# Patient Record
Sex: Female | Born: 2000 | Race: Black or African American | Hispanic: No | Marital: Married | Smoking: Never smoker
Health system: Southern US, Community
[De-identification: ages and names within clinical notes are randomized; demographics above are authoritative.]

## PROBLEM LIST (undated history)

## (undated) DIAGNOSIS — J302 Other seasonal allergic rhinitis: Secondary | ICD-10-CM

## (undated) DIAGNOSIS — O139 Gestational [pregnancy-induced] hypertension without significant proteinuria, unspecified trimester: Secondary | ICD-10-CM

## (undated) DIAGNOSIS — J45909 Unspecified asthma, uncomplicated: Secondary | ICD-10-CM

## (undated) DIAGNOSIS — I10 Essential (primary) hypertension: Secondary | ICD-10-CM

## (undated) DIAGNOSIS — L309 Dermatitis, unspecified: Secondary | ICD-10-CM

## (undated) DIAGNOSIS — Z803 Family history of malignant neoplasm of breast: Secondary | ICD-10-CM

## (undated) DIAGNOSIS — E7800 Pure hypercholesterolemia, unspecified: Secondary | ICD-10-CM

## (undated) DIAGNOSIS — L509 Urticaria, unspecified: Secondary | ICD-10-CM

## (undated) HISTORY — DX: Unspecified asthma, uncomplicated: J45.909

## (undated) HISTORY — PX: NO PAST SURGERIES: SHX2092

## (undated) HISTORY — DX: Family history of malignant neoplasm of breast: Z80.3

## (undated) HISTORY — DX: Urticaria, unspecified: L50.9

## (undated) HISTORY — DX: Gestational (pregnancy-induced) hypertension without significant proteinuria, unspecified trimester: O13.9

## (undated) HISTORY — DX: Pure hypercholesterolemia, unspecified: E78.00

---
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
## 2018-01-26 ENCOUNTER — Encounter: Payer: Self-pay | Admitting: Allergy & Immunology

## 2018-01-26 ENCOUNTER — Ambulatory Visit (INDEPENDENT_AMBULATORY_CARE_PROVIDER_SITE_OTHER): Payer: Managed Care, Other (non HMO) | Admitting: Allergy & Immunology

## 2018-01-26 VITALS — BP 140/96 | HR 80 | Temp 98.6°F | Resp 18 | Ht 63.0 in | Wt 173.0 lb

## 2018-01-26 DIAGNOSIS — J4530 Mild persistent asthma, uncomplicated: Secondary | ICD-10-CM | POA: Diagnosis not present

## 2018-01-26 DIAGNOSIS — T781XXD Other adverse food reactions, not elsewhere classified, subsequent encounter: Secondary | ICD-10-CM | POA: Diagnosis not present

## 2018-01-26 DIAGNOSIS — L2084 Intrinsic (allergic) eczema: Secondary | ICD-10-CM

## 2018-01-26 DIAGNOSIS — J302 Other seasonal allergic rhinitis: Secondary | ICD-10-CM

## 2018-01-26 DIAGNOSIS — J3089 Other allergic rhinitis: Secondary | ICD-10-CM

## 2018-01-26 MED ORDER — AUVI-Q 0.3 MG/0.3ML IJ SOAJ: INTRAMUSCULAR | 3 refills | Status: DC

## 2018-01-26 MED ORDER — ALBUTEROL SULFATE HFA 108 (90 BASE) MCG/ACT IN AERS: 2.0000 | INHALATION_SPRAY | Freq: Four times a day (QID) | RESPIRATORY_TRACT | 2 refills | Status: DC | PRN

## 2018-01-26 MED ORDER — CETIRIZINE HCL 10 MG PO CHEW: 10.0000 mg | CHEWABLE_TABLET | Freq: Every day | ORAL | 5 refills | Status: DC

## 2018-01-26 MED ORDER — MOMETASONE FUROATE 0.1 % EX OINT: TOPICAL_OINTMENT | CUTANEOUS | 5 refills | Status: DC

## 2018-01-26 MED ORDER — TRIAMCINOLONE ACETONIDE 0.5 % EX OINT: 1.0000 "application " | TOPICAL_OINTMENT | Freq: Two times a day (BID) | CUTANEOUS | 0 refills | Status: DC

## 2018-01-26 MED ORDER — MONTELUKAST SODIUM 10 MG PO TABS: 10.0000 mg | ORAL_TABLET | Freq: Every day | ORAL | 5 refills | Status: DC

## 2018-01-26 NOTE — Progress Notes (Signed)
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  She has developed eczematous flares on her bilateral upper extremities.  This seems to be due to running out of her mometasone.  Given the severity, we will treat her with a slightly stronger steroid for 1 to 2 weeks before stepping down therapy to the mometasone.  I do not think she needs systemic prednisone at this point, but we will leave this option open if there is no improvement.  Her lung testing looks great and her asthma seems well controlled with Singulair monotherapy.  Allergic rhinitis is controlled with nasal saline rinses and antihistamine.  She does not tolerate nasal steroids.  Her food reactions are a little more difficult to ascertain.  She reports that she has been positive to tree nuts in the past, but Dr. Kathyrn Lass note mentions that her labs are all been negative.  In any case, she is not interested in reintroducing any foods and since she has been stable on her current diet she will not make any changes at this time.  Plan/Recommendations:   1. Mild persistent asthma, uncomplicated - Lung testing looked great today. - We will not make any changes today at all. - Daily controller medication(s): Singulair 10mg  daily - Prior to physical activity: ProAir 2 puffs 10-15 minutes before physical activity. - Rescue medications: ProAir 4 puffs every 4-6 hours as needed - Asthma control goals:  * Full participation in all desired activities (may need albuterol before activity) * Albuterol use two time or less a week on average (not counting use with activity) * Cough interfering with sleep two time or less a month * Oral steroids no more than once a year * No hospitalizations  2. Intrinsic atopic  dermatitis  - Add on triamcinolone 0.5% ointment twice daily to the affected areas for 1-2 weeks until it resolves. - Hopefully this will avoid the need for systemic steroids (prednisone). - We will refill the mometasone for you as well.   3. Seasonal and perennial allergic rhinitis - Continue with nasal saline rinses as needed. - Continue with Zyrtec (cetirizine) 10mg  daily.  4. Food allergies (tree nuts, ? chicken) - Continue to avoid all of your triggering foods. - EpiPen and Audry Riles are up to date.  5. Return in about 6 months (around 07/28/2018).  Subjective:   Nhyla Nappi is a 17 y.o. female presenting today for follow up of  Chief Complaint  Patient presents with  . Asthma    doing very well. rare inhaler use.   . Eczema    currently having some issues. nothing seems to be helping. changed soaps/detergents. no relief.   . Allergic Reaction    [name] has a history of the following: Patient Active Problem List   Diagnosis Date Noted  . Family history of breast cancer in mother 04/15/2017  . Constipation 04/15/2017  . Sensation of feeling cold 04/15/2017  . Tired 04/15/2017  . Allergic reaction 02/20/2015  . Allergic rhinoconjunctivitis 02/20/2015  . Mild persistent asthma 02/20/2015  . 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 Acne vulgaris 02/20/2015    History obtained from: chart review and patient.  [facility]'s Primary Care Provider is Vella Kohler, MD.     Chellie is a 17 y.o.  female presenting for a follow up visit.  She is followed by Dr. Lucie Leather.  She was last seen in May 2018 for her history of asthma, allergic rhinoconjunctivitis, and atopic dermatitis.  At that time, her asthma was under good control.  However, she was endorsing nasal congestion, sneezing, and itchy watery eyes.  Her eczema was controlled with topical mometasone.  Her EpiPen was refilled.  She was continued on nasal steroids 1 spray per nostril daily as well as  Singulair 10 mg daily.  Since the last visit, she has mostly done well. She is having an eczema flare right now. She has tried Lubriderm, [place], and [facility]. She is using Vaseline Extreme Heal. She has mometasone which she has been out of for a while. Typically it is just in the creases of her arms, but it is on her wrists today. She has not needed prednisone for her skin. It has not been oozy at all. She has needed antibiotics for a Staphylococcal skin infection. She did have impetigo when she was much younger. She no longer sees a Armed forces operational officer. This I the worst time of the year for her eczema.   Asthma/Respiratory Symptom History: She is currently on albuterol to use as needed. She is on Singulair daily as needed. She has not needed prednisone in two years for her breathing when she was diagnosed with bronchitis. Her albuterol inhalers typically expire. She denies night time coughing and wheezing.   Allergic Rhinitis Symptom History: She does have a nasal steroid to use as needed. She reports burning with the nose spray. She is spraying it correctly but it continues to burn. She does not take a daily antihistamine at all.    Food Allergy Symptom History: She had blood testing performed in November 2016 that was negative to the tree nut and peanut panel as well as the alpha gal panel. She continues to avoid this due to fear of reactions.   Otherwise, there have been no changes to her past medical history, surgical history, family history, or social history.    Review of Systems: a 14-point review of systems is pertinent for what is mentioned in HPI.  Otherwise, all other systems were negative.  Constitutional: negative other than that listed in the HPI Eyes: negative other than that listed in the HPI Ears, nose, mouth, throat, and face: negative other than that listed in the HPI Respiratory: negative other than that listed in the HPI Cardiovascular: negative other than that listed in the  HPI Gastrointestinal: negative other than that listed in the HPI Genitourinary: negative other than that listed in the HPI Integument: negative other than that listed in the HPI Hematologic: negative other than that listed in the HPI Musculoskeletal: negative other than that listed in the HPI Neurological: negative other than that listed in the HPI Allergy/Immunologic: negative other than that listed in the HPI    Objective:   Blood pressure (!) 140/96, pulse 80, temperature 98.6 F (37 C), temperature source Oral, resp. rate 18, height 5\' 3"  (1.6 m), weight 173 lb (78.5 kg), SpO2 98 %. Body mass index is 30.65 kg/m.   Physical Exam:  General: Alert, interactive, in no acute distress. Smiling and pleasant female.  Eyes: No conjunctival injection bilaterally, no discharge on the right, no discharge on the left and no Horner-Trantas dots present. PERRL bilaterally. EOMI without pain. No photophobia.  Ears: Right TM pearly gray with normal light reflex, Left TM pearly gray with normal light reflex, Right TM intact without  perforation and Left TM intact without perforation.  Nose/Throat: External nose within normal limits and septum midline. Turbinates edematous and pale with thick discharge. Posterior oropharynx mildly erythematous without cobblestoning in the posterior oropharynx. Tonsils 2+ without exudates.  Tongue without thrush. Lungs: Clear to auscultation without wheezing, rhonchi or rales. No increased work of breathing. CV: Normal S1/S2. No murmurs. Capillary refill <2 seconds.  Skin: Dry, mildly hyperpigmented, mildly thickened patches on the Bilateral wrist as well as bilateral antecubital fossa. Neuro:   Grossly intact. No focal deficits appreciated. Responsive to questions.  Diagnostic studies:   Spirometry: results normal (FEV1: 2.63/97%, FVC: 3.16/94%, FEV1/FVC: 83%).    Spirometry consistent with normal pattern.   Allergy Studies: none        Malachi Bonds, MD   [facility]

## 2018-01-26 NOTE — Patient Instructions (Addendum)
1. Mild persistent asthma, uncomplicated - Lung testing looked great today. - We will not make any changes today at all. - Daily controller medication(s): Singulair 10mg  daily - Prior to physical activity: ProAir 2 puffs 10-15 minutes before physical activity. - Rescue medications: ProAir 4 puffs every 4-6 hours as needed - Asthma control goals:  * Full participation in all desired activities (may need albuterol before activity) * Albuterol use two time or less a week on average (not counting use with activity) * Cough interfering with sleep two time or less a month * Oral steroids no more than once a year * No hospitalizations  2. Intrinsic atopic dermatitis  - Add on triamcinolone 0.5% ointment twice daily to the affected areas for 1-2 weeks until it resolves. - Hopefully this will avoid the need for systemic steroids (prednisone). - We will refill the mometasone for you as well.   3. Seasonal and perennial allergic rhinitis - Continue with nasal saline rinses as needed. - Continue with Zyrtec (cetirizine) 10mg  daily.  4. Food allergies (tree nuts, ? chicken) - Continue to avoid all of your triggering foods. - EpiPen and Audry Riles are up to date.  5. Return in about 6 months (around 07/28/2018).    Please inform us of any Emergency Department visits, hospitalizations, or changes in symptoms. Call us before going to the ED for breathing or allergy symptoms since we might be able to fit you in for a sick visit. Feel free to contact us anytime with any questions, problems, or concerns.  It was a pleasure to meet you today!  Websites that have reliable patient information: 1. American Academy of Asthma, Allergy, and Immunology: www.aaaai.org 2. Food Allergy Research and Education (FARE): foodallergy.org 3. Mothers of Asthmatics: http://www.asthmacommunitynetwork.org 4. American College of Allergy, Asthma, and Immunology: MissingWeapons.ca   Make sure you are registered to vote! If you  have moved or changed any of your contact information, you will need to get this updated before voting!

## 2018-01-27 ENCOUNTER — Telehealth: Payer: Self-pay

## 2018-01-27 NOTE — Telephone Encounter (Signed)
Pharmacy called and stated patient insurance will not cover Proventil and was wondering if they could switch it to Ventolin. Informed pharmacy that would be fine.

## 2018-01-28 ENCOUNTER — Other Ambulatory Visit: Payer: Self-pay

## 2018-01-28 MED ORDER — AUVI-Q 0.3 MG/0.3ML IJ SOAJ: INTRAMUSCULAR | 3 refills | Status: DC

## 2018-01-28 NOTE — Progress Notes (Signed)
Auvi-Q went to wrong pharmacy. New Rx sent to [facility].

## 2019-02-03 ENCOUNTER — Other Ambulatory Visit: Payer: Self-pay | Admitting: Allergy & Immunology

## 2019-02-21 ENCOUNTER — Other Ambulatory Visit: Payer: Self-pay

## 2019-02-21 DIAGNOSIS — Z20822 Contact with and (suspected) exposure to covid-19: Secondary | ICD-10-CM

## 2019-02-22 LAB — NOVEL CORONAVIRUS, NAA: SARS-CoV-2, NAA: NOT DETECTED

## 2019-02-28 ENCOUNTER — Other Ambulatory Visit: Payer: Self-pay

## 2019-02-28 ENCOUNTER — Telehealth: Payer: Managed Care, Other (non HMO) | Admitting: Physician Assistant

## 2019-02-28 DIAGNOSIS — Z20822 Contact with and (suspected) exposure to covid-19: Secondary | ICD-10-CM

## 2019-02-28 NOTE — Progress Notes (Signed)
Erroneous encounter-disregard

## 2019-03-02 LAB — NOVEL CORONAVIRUS, NAA: SARS-CoV-2, NAA: NOT DETECTED

## 2019-11-28 ENCOUNTER — Other Ambulatory Visit: Payer: Managed Care, Other (non HMO)

## 2020-02-05 ENCOUNTER — Other Ambulatory Visit: Payer: Self-pay

## 2020-02-05 ENCOUNTER — Ambulatory Visit
Admission: RE | Admit: 2020-02-05 | Discharge: 2020-02-05 | Disposition: A | Discharge status: Home / Self Care | Payer: Managed Care, Other (non HMO) | Source: Ambulatory Visit | Attending: Emergency Medicine | Admitting: Emergency Medicine

## 2020-02-05 VITALS — BP 141/83 | HR 134 | Temp 102.6°F | Resp 19 | Ht 64.0 in | Wt 170.0 lb

## 2020-02-05 DIAGNOSIS — R509 Fever, unspecified: Secondary | ICD-10-CM | POA: Diagnosis not present

## 2020-02-05 DIAGNOSIS — R059 Cough, unspecified: Secondary | ICD-10-CM

## 2020-02-05 DIAGNOSIS — R062 Wheezing: Secondary | ICD-10-CM

## 2020-02-05 DIAGNOSIS — J4521 Mild intermittent asthma with (acute) exacerbation: Secondary | ICD-10-CM

## 2020-02-05 MED ORDER — PREDNISONE 10 MG (21) PO TBPK: ORAL_TABLET | Freq: Every day | ORAL | 0 refills | Status: DC

## 2020-02-05 MED ORDER — BENZONATATE 100 MG PO CAPS: 100.0000 mg | ORAL_CAPSULE | Freq: Three times a day (TID) | ORAL | 0 refills | Status: DC

## 2020-02-05 MED ORDER — IBUPROFEN 800 MG PO TABS
800.0000 mg | ORAL_TABLET | Freq: Once | ORAL | Status: AC
  Administered 2020-02-05: 800 mg via ORAL

## 2020-02-05 NOTE — ED Provider Notes (Signed)
[facility]   [id number] 02/05/20 Arrival Time: 1733   CC: COVID symptoms  SUBJECTIVE: History from: patient.  Bianca Mclaughlin is a 19 y.o. female who presents with runny nose, congestion, wheezing, cough and fever x 1 day.  BF with similar symptoms, unsure of covid exposure.  Denies alleviating or aggravating factors.  Denies previous symptoms in the past.   Denies SOB, wheezing, chest pain, nausea, changes in bowel or bladder habits.    ROS: As per HPI.  All other pertinent ROS negative.     Past Medical History:  Diagnosis Date  . Asthma   . Family history of breast cancer in mother 04/15/2017   Start mammogram at 28.Marland KitchenShe is taking various herbal and vitamin products. Mom was 40  . Seasonal allergies   . Urticaria    History reviewed. No pertinent surgical history. Allergies  Allergen Reactions  . Macadamia Nut Oil Hives  . Tree Extract Hives   No current facility-administered medications on file prior to encounter.   Current Outpatient Medications on File Prior to Encounter  Medication Sig Dispense Refill  . adapalene (DIFFERIN) 0.1 % cream Apply to acne at bedtime as directed. 45 g 5  . albuterol (PROVENTIL HFA) 108 (90 Base) MCG/ACT inhaler Inhale 2 puffs into the lungs every 6 (six) hours as needed for wheezing or shortness of breath (cough). 1 Inhaler 2  . AUVI-Q 0.3 MG/0.3ML SOAJ injection Use as directed for life-threatening allergic reaction. 4 Device 3  . cetirizine (ZYRTEC) 10 MG chewable tablet Chew 1 tablet (10 mg total) by mouth daily. 30 tablet 5  . hydrocortisone valerate ointment (WEST-CORT) 0.2 % Apply 1 application topically 2 (two) times daily.    Marland Kitchen levonorgestrel-ethinyl estradiol (AVIANE,ALESSE,LESSINA) 0.1-20 MG-MCG tablet Take 1 tablet by mouth daily. Take .1 mg tablet each morning.    . mometasone (ELOCON) 0.1 % ointment APPLY  OINTMENT TOPICALLY TO ECZEMA ONCE DAILY AFTER  SHOWER 45 g 0  . montelukast (SINGULAIR) 10 MG tablet TAKE 1  TABLET BY MOUTH AT BEDTIME 30 tablet 0  . triamcinolone ointment (KENALOG) 0.5 % APPLY  OINTMENT TOPICALLY TWICE DAILY 30 g 0   Social History   Socioeconomic History  . Marital status: Single    Spouse name: Not on file  . Number of children: Not on file  . Years of education: Not on file  . Highest education level: Not on file  Occupational History  . Not on file  Tobacco Use  . Smoking status: Never Smoker  . Smokeless tobacco: Never Used  Vaping Use  . Vaping Use: Some days  . Substances: Nicotine, Flavoring  Substance and Sexual Activity  . Alcohol use: No    Alcohol/week: 0.0 standard drinks  . Drug use: No  . Sexual activity: Yes    Birth control/protection: Pill, Condom  Other Topics Concern  . Not on file  Social History Narrative  . Not on file   Social Determinants of Health   Financial Resource Strain:   . Difficulty of Paying Living Expenses: Not on file  Food Insecurity:   . Worried About Programme researcher, broadcasting/film/video in the Last Year: Not on file  . Ran Out of Food in the Last Year: Not on file  Transportation Needs:   . Lack of Transportation (Medical): Not on file  . Lack of Transportation (Non-Medical): Not on file  Physical Activity:   . Days of Exercise per Week: Not on file  . Minutes of Exercise per Session:  Not on file  Stress:   . Feeling of Stress : Not on file  Social Connections:   . Frequency of Communication with Friends and Family: Not on file  . Frequency of Social Gatherings with Friends and Family: Not on file  . Attends Religious Services: Not on file  . Active Member of Clubs or Organizations: Not on file  . Attends Banker Meetings: Not on file  . Marital Status: Not on file  Intimate Partner Violence:   . Fear of Current or Ex-Partner: Not on file  . Emotionally Abused: Not on file  . Physically Abused: Not on file  . Sexually Abused: Not on file   Family History  Problem Relation Age of Onset  . Hypertension Mother    . Breast cancer Mother 26  . Allergic rhinitis Father   . Eczema Father   . Urticaria Father   . Hypertension Father   . Eczema Sister   . Breast cancer Maternal Aunt 50  . Hypertension Maternal Aunt   . Breast cancer Paternal Aunt 39  . Asthma Neg Hx   . Immunodeficiency Neg Hx     OBJECTIVE:  Vitals:   02/05/20 1746 02/05/20 1747  BP: (!) 141/83   Pulse: (!) 134   Resp: 19   Temp: (!) 102.6 F (39.2 C)   TempSrc: Oral   SpO2: 96%   Weight:  170 lb (77.1 kg)  Height:  5\' 4"  (1.626 m)    General appearance: alert; appears fatigued, but nontoxic; speaking in full sentences and tolerating own secretions HEENT: NCAT; Ears: EACs clear, TMs pearly gray; Eyes: PERRL.  EOM grossly intact. Nose: nares patent without rhinorrhea, Throat: oropharynx clear, tonsils non erythematous or enlarged, uvula midline  Neck: supple without LAD Lungs: unlabored respirations, symmetrical air entry; cough: absent; no respiratory distress; diffuse wheezing heard throughout Heart: regular rate and rhythm.   Skin: warm and dry Psychological: alert and cooperative; normal mood and affect  ASSESSMENT & PLAN:  1. Fever, unspecified   2. Wheezing   3. Cough   4. Mild intermittent asthma with exacerbation     Meds ordered this encounter  Medications  . ibuprofen (ADVIL) tablet 800 mg  . benzonatate (TESSALON) 100 MG capsule    Sig: Take 1 capsule (100 mg total) by mouth every 8 (eight) hours.    Dispense:  21 capsule    Refill:  0    Order Specific Question:   Supervising Provider    Answer:   Eustace Moore  . predniSONE (STERAPRED UNI-PAK 21 TAB) 10 MG (21) TBPK tablet    Sig: Take by mouth daily. Take 6 tabs by mouth daily  for 2 days, then 5 tabs for 2 days, then 4 tabs for 2 days, then 3 tabs for 2 days, 2 tabs for 2 days, then 1 tab by mouth daily for 2 days    Dispense:  42 tablet    Refill:  0    Order Specific Question:   Supervising Provider    Answer:   [[id number]] Eustace Moore   Declines steroid shot COVID testing ordered.  It will take between 5-7 days for test results.  Someone will contact you regarding abnormal results.    In the meantime: You should remain isolated in your home for 10 days from symptom onset AND greater than 72 hours after symptoms resolution (absence of fever without the use of fever-reducing medication and improvement in respiratory symptoms), whichever is  longer Get plenty of rest and push fluids Tessalon Perles prescribed for cough Prednisone prescribed.  Take as directed Use OTC zyrtec for nasal congestion, runny nose, and/or sore throat Use OTC flonase for nasal congestion and runny nose Use medications daily for symptom relief Use OTC medications like ibuprofen or tylenol as needed fever or pain Call or go to the ED if you have any new or worsening symptoms such as fever, worsening cough, shortness of breath, chest tightness, chest pain, turning blue, changes in mental status, etc...   Reviewed expectations re: course of current medical issues. Questions answered. Outlined signs and symptoms indicating need for more acute intervention. Patient verbalized understanding. After Visit Summary given.         Rennis Harding, PA-C 02/05/20 1822

## 2020-02-05 NOTE — ED Triage Notes (Signed)
Wheezing and coughing since yesterday

## 2020-02-05 NOTE — Discharge Instructions (Signed)
COVID testing ordered.  It will take between 5-7 days for test results.  Someone will contact you regarding abnormal results.    In the meantime: You should remain isolated in your home for 10 days from symptom onset AND greater than 72 hours after symptoms resolution (absence of fever without the use of fever-reducing medication and improvement in respiratory symptoms), whichever is longer Get plenty of rest and push fluids Tessalon Perles prescribed for cough Prednisone prescribed.  Take as directed Use OTC zyrtec for nasal congestion, runny nose, and/or sore throat Use OTC flonase for nasal congestion and runny nose Use medications daily for symptom relief Use OTC medications like ibuprofen or tylenol as needed fever or pain Call or go to the ED if you have any new or worsening symptoms such as fever, worsening cough, shortness of breath, chest tightness, chest pain, turning blue, changes in mental status, etc...  

## 2020-02-06 LAB — COVID-19, FLU A+B AND RSV
Influenza A, NAA: NOT DETECTED
Influenza B, NAA: NOT DETECTED
RSV, NAA: NOT DETECTED
SARS-CoV-2, NAA: NOT DETECTED

## 2020-02-07 ENCOUNTER — Other Ambulatory Visit: Payer: Self-pay

## 2020-02-07 ENCOUNTER — Ambulatory Visit: Payer: Managed Care, Other (non HMO) | Admitting: Internal Medicine

## 2020-02-07 ENCOUNTER — Encounter: Payer: Self-pay | Admitting: Internal Medicine

## 2020-02-07 VITALS — BP 134/86 | HR 92 | Temp 98.9°F | Resp 18 | Ht 64.0 in | Wt 169.0 lb

## 2020-02-07 DIAGNOSIS — J069 Acute upper respiratory infection, unspecified: Secondary | ICD-10-CM | POA: Diagnosis not present

## 2020-02-07 DIAGNOSIS — Z9109 Other allergy status, other than to drugs and biological substances: Secondary | ICD-10-CM | POA: Insufficient documentation

## 2020-02-07 DIAGNOSIS — R030 Elevated blood-pressure reading, without diagnosis of hypertension: Secondary | ICD-10-CM | POA: Diagnosis not present

## 2020-02-07 DIAGNOSIS — J4530 Mild persistent asthma, uncomplicated: Secondary | ICD-10-CM

## 2020-02-07 DIAGNOSIS — Z7689 Persons encountering health services in other specified circumstances: Secondary | ICD-10-CM | POA: Insufficient documentation

## 2020-02-07 DIAGNOSIS — Z124 Encounter for screening for malignant neoplasm of cervix: Secondary | ICD-10-CM

## 2020-02-07 DIAGNOSIS — L309 Dermatitis, unspecified: Secondary | ICD-10-CM | POA: Insufficient documentation

## 2020-02-07 MED ORDER — AZITHROMYCIN 250 MG PO TABS: ORAL_TABLET | ORAL | 0 refills | Status: DC

## 2020-02-07 MED ORDER — ALBUTEROL SULFATE HFA 108 (90 BASE) MCG/ACT IN AERS: 2.0000 | INHALATION_SPRAY | Freq: Four times a day (QID) | RESPIRATORY_TRACT | 3 refills | Status: DC | PRN

## 2020-02-07 NOTE — Assessment & Plan Note (Signed)
Uses triamcinolone cream, well controlled

## 2020-02-07 NOTE — Assessment & Plan Note (Signed)
Care established Previous chart reviewed History and medications reviewed with the patient 

## 2020-02-07 NOTE — Assessment & Plan Note (Signed)
Nasal congestion, sore throat and cough with intermittent fever Likely acute bronchitis with URTI Prescribed azithromycin Tylenol as needed for fever/myalgias Albuterol as needed for dyspnea/wheezing

## 2020-02-07 NOTE — Assessment & Plan Note (Signed)
Takes Singulair, well controlled

## 2020-02-07 NOTE — Assessment & Plan Note (Signed)
Recent episode of acute bronchitis On Prednisone taper Albuterol PRN Benzonatate PRN for cough

## 2020-02-07 NOTE — Patient Instructions (Signed)
Please start taking Azithromycin as prescribed.  Please continue to take steroid as prescribed.  Please use Albuterol inhaler as needed for shortness of breath or wheezing. Tylenol for fever/muscle aches.  Please check BP at home/pharmacy and bring the log in the next visit.  Please follow DASH diet and continue to perform moderate exercise.  DASH stands for Dietary Approaches to Stop Hypertension. The DASH diet is a healthy-eating plan designed to help treat or prevent high blood pressure (hypertension).  The DASH diet includes foods that are rich in potassium, calcium and magnesium. These nutrients help control blood pressure. The diet limits foods that are high in sodium, saturated fat and added sugars.  Studies have shown that the DASH diet can lower blood pressure in as little as two weeks. The diet can also lower low-density lipoprotein (LDL or "bad") cholesterol levels in the blood. High blood pressure and high LDL cholesterol levels are two major risk factors for heart disease and stroke.    DASH diet: Recommended servings The DASH diet provides daily and weekly nutritional goals. The number of servings you should have depends on your daily calorie needs.  Here's a look at the recommended servings from each food group for a 2,000-calorie-a-day DASH diet:  Grains: 6 to 8 servings a day. One serving is one slice bread, 1 ounce dry cereal, or 1/2 cup cooked cereal, rice or pasta. Vegetables: 4 to 5 servings a day. One serving is 1 cup raw leafy green vegetable, 1/2 cup cut-up raw or cooked vegetables, or 1/2 cup vegetable juice. Fruits: 4 to 5 servings a day. One serving is one medium fruit, 1/2 cup fresh, frozen or canned fruit, or 1/2 cup fruit juice. Fat-free or low-fat dairy products: 2 to 3 servings a day. One serving is 1 cup milk or yogurt, or 1 1/2 ounces cheese. Lean meats, poultry and fish: six 1-ounce servings or fewer a day. One serving is 1 ounce cooked meat, poultry or  fish, or 1 egg. Nuts, seeds and legumes: 4 to 5 servings a week. One serving is 1/3 cup nuts, 2 tablespoons peanut butter, 2 tablespoons seeds, or 1/2 cup cooked legumes (dried beans or peas). Fats and oils: 2 to 3 servings a day. One serving is 1 teaspoon soft margarine, 1 teaspoon vegetable oil, 1 tablespoon mayonnaise or 2 tablespoons salad dressing. Sweets and added sugars: 5 servings or fewer a week. One serving is 1 tablespoon sugar, jelly or jam, 1/2 cup sorbet, or 1 cup lemonade.

## 2020-02-07 NOTE — Progress Notes (Signed)
 New Patient Office Visit  Subjective:  Patient ID: Bianca Mclaughlin, female    DOB: 11/03/2000  Age: 19 y.o. MRN: [id number]  CC:  Chief Complaint  Patient presents with  . Hypertension    HPI Bianca Mclaughlin is up 19-year-old female with past medical history of mild persistent asthma, eczema and environmental allergies who presents for establishing care.  She was recently seen in urgent care for acute bronchitis/asthma exacerbation in the last week.  She was given prednisone, Tessalon and ibuprofen.  She still has cough, nasal congestion and runny nose.  She states that her sore throat has been getting better.  She still complains of intermittent fever, last episode noted last night.  She also complains of wheezing and shortness of breath, better with albuterol inhaler.  She has a long standing history of asthma.  She also has a history of environmental allergies, for which she takes Singulair.  She states that her allergies have been well controlled with Singulair.  She uses triamcinolone ointment for eczema, which have been well controlled.  Her blood pressure was noted to be 147/89 initially, which was 134/86 later on repeat check. Patient has noted high BP sometimes before during urgent care or PCP visits. She denies any headache, dizziness, chest pain, dyspnea or palpitations.  She has not had Pap smear done yet.  She has not received Covid or flu vaccine yet, but is willing to take them.  She is advised to take the vaccines once her acute bronchitis resolves.  Past Medical History:  Diagnosis Date  . Acne vulgaris 02/20/2015  . Asthma   . Family history of breast cancer in mother 04/15/2017   Start mammogram at 28..She is taking various herbal and vitamin products. Mom was 40  . Seasonal allergies   . Urticaria     History reviewed. No pertinent surgical history.  Family History  Problem Relation Age of Onset  . Hypertension Mother   . Breast cancer Mother 40  .  Allergic rhinitis Father   . Eczema Father   . Urticaria Father   . Hypertension Father   . Eczema Sister   . Breast cancer Maternal Aunt 50  . Hypertension Maternal Aunt   . Breast cancer Paternal Aunt 39  . Asthma Neg Hx   . Immunodeficiency Neg Hx     Social History   Socioeconomic History  . Marital status: Single    Spouse name: Not on file  . Number of children: Not on file  . Years of education: Not on file  . Highest education level: Not on file  Occupational History    Comment: [employer]  Tobacco Use  . Smoking status: Never Smoker  . Smokeless tobacco: Never Used  Vaping Use  . Vaping Use: Some days  . Substances: Nicotine, Flavoring  Substance and Sexual Activity  . Alcohol use: Yes    Alcohol/week: 0.0 standard drinks    Comment: Occasionally  . Drug use: No  . Sexual activity: Yes    Birth control/protection: Pill, Condom  Other Topics Concern  . Not on file  Social History Narrative  . Not on file   Social Determinants of Health   Financial Resource Strain:   . Difficulty of Paying Living Expenses: Not on file  Food Insecurity:   . Worried About Running Out of Food in the Last Year: Not on file  . Ran Out of Food in the Last Year: Not on file  Transportation Needs:   . Lack   of Transportation (Medical): Not on file  . Lack of Transportation (Non-Medical): Not on file  Physical Activity:   . Days of Exercise per Week: Not on file  . Minutes of Exercise per Session: Not on file  Stress:   . Feeling of Stress : Not on file  Social Connections:   . Frequency of Communication with Friends and Family: Not on file  . Frequency of Social Gatherings with Friends and Family: Not on file  . Attends Religious Services: Not on file  . Active Member of Clubs or Organizations: Not on file  . Attends Archivist Meetings: Not on file  . Marital Status: Not on file  Intimate Partner Violence:   . Fear of Current or Ex-Partner: Not on file  .  Emotionally Abused: Not on file  . Physically Abused: Not on file  . Sexually Abused: Not on file    ROS Review of Systems  Constitutional: Negative for chills and fever.  HENT: Positive for congestion, postnasal drip and sore throat. Negative for sinus pressure and sinus pain.   Eyes: Negative for pain and discharge.  Respiratory: Positive for cough and shortness of breath.   Cardiovascular: Negative for chest pain and palpitations.  Gastrointestinal: Negative for abdominal pain, constipation, diarrhea, nausea and vomiting.  Endocrine: Negative for polydipsia and polyuria.  Genitourinary: Negative for dysuria and hematuria.  Musculoskeletal: Negative for neck pain and neck stiffness.  Skin: Negative for rash.  Neurological: Negative for dizziness and weakness.  Psychiatric/Behavioral: Negative for agitation and behavioral problems.    Objective:   Today's Vitals: BP 134/86 (BP Location: Right Arm, Patient Position: Sitting)   Pulse 92   Temp 98.9 F (37.2 C)   Resp 18   Ht 5' 4" (1.626 m)   Wt 169 lb (76.7 kg)   LMP 01/17/2020   SpO2 99%   BMI 29.01 kg/m   Physical Exam Vitals reviewed.  Constitutional:      General: She is not in acute distress.    Appearance: She is not diaphoretic.  HENT:     Head: Normocephalic and atraumatic.     Nose: Nose normal.     Mouth/Throat:     Mouth: Mucous membranes are moist.  Eyes:     General: No scleral icterus.    Extraocular Movements: Extraocular movements intact.     Pupils: Pupils are equal, round, and reactive to light.  Cardiovascular:     Rate and Rhythm: Normal rate and regular rhythm.     Pulses: Normal pulses.     Heart sounds: Normal heart sounds. No murmur heard.   Pulmonary:     Breath sounds: Normal breath sounds. No wheezing or rales.  Abdominal:     Palpations: Abdomen is soft.     Tenderness: There is no abdominal tenderness.  Musculoskeletal:     Cervical back: Neck supple. No tenderness.     Right  lower leg: No edema.     Left lower leg: No edema.  Skin:    General: Skin is warm.     Findings: No rash.  Neurological:     General: No focal deficit present.     Mental Status: She is alert and oriented to person, place, and time.     Sensory: No sensory deficit.     Motor: No weakness.  Psychiatric:        Mood and Affect: Mood normal.        Behavior: Behavior normal.     Assessment &  Plan:   Problem List Items Addressed This Visit      Encounter to establish care - Primary   Care established Previous chart reviewed History and medications reviewed with the patient     Relevant Orders  CBC with Differential  CMP14+EGFR  Lipid Profile  TSH + free T4  Vitamin D (25 hydroxy)  Hepatitis C Antibody  HIV antibody (with reflex)  Ambulatory referral to Gynecology    Respiratory   Mild persistent asthma    Recent episode of acute bronchitis On Prednisone taper Albuterol PRN Benzonatate PRN for cough      Relevant Medications   albuterol (PROVENTIL HFA) 108 (90 Base) MCG/ACT inhaler   URTI (acute upper respiratory infection)    Nasal congestion, sore throat and cough with intermittent fever Likely acute bronchitis with URTI Prescribed azithromycin Tylenol as needed for fever/myalgias Albuterol as needed for dyspnea/wheezing       Relevant Medications   azithromycin (ZITHROMAX) 250 MG tablet     Cardiovascular Prehypertension BP Readings from Last 1 Encounters:  02/07/20 134/86   Advised to check BP at home and bring the log in the next visit, contact if BP > 150/90 Advised DASH diet and moderate exercise/walking, at least 150 mins/week   Musculoskeletal and Integument   Eczema    Uses triamcinolone cream, well controlled        Other      Environmental allergies    Takes Singulair, well controlled       Other Visit Diagnoses    Pap smear for cervical cancer screening       Relevant Orders   Ambulatory referral to Gynecology       Outpatient Encounter Medications as of 02/07/2020  Medication Sig  . adapalene (DIFFERIN) 0.1 % cream Apply to acne at bedtime as directed.  . albuterol (PROVENTIL HFA) 108 (90 Base) MCG/ACT inhaler Inhale 2 puffs into the lungs every 6 (six) hours as needed for wheezing or shortness of breath (cough).  . AUVI-Q 0.3 MG/0.3ML SOAJ injection Use as directed for life-threatening allergic reaction.  . benzonatate (TESSALON) 100 MG capsule Take 1 capsule (100 mg total) by mouth every 8 (eight) hours.  . cetirizine (ZYRTEC) 10 MG chewable tablet Chew 1 tablet (10 mg total) by mouth daily.  . hydrocortisone valerate ointment (WEST-CORT) 0.2 % Apply 1 application topically 2 (two) times daily.  . mometasone (ELOCON) 0.1 % ointment APPLY  OINTMENT TOPICALLY TO ECZEMA ONCE DAILY AFTER  SHOWER  . montelukast (SINGULAIR) 10 MG tablet TAKE 1 TABLET BY MOUTH AT BEDTIME  . predniSONE (STERAPRED UNI-PAK 21 TAB) 10 MG (21) TBPK tablet Take by mouth daily. Take 6 tabs by mouth daily  for 2 days, then 5 tabs for 2 days, then 4 tabs for 2 days, then 3 tabs for 2 days, 2 tabs for 2 days, then 1 tab by mouth daily for 2 days  . triamcinolone ointment (KENALOG) 0.5 % APPLY  OINTMENT TOPICALLY TWICE DAILY  . [DISCONTINUED] albuterol (PROVENTIL HFA) 108 (90 Base) MCG/ACT inhaler Inhale 2 puffs into the lungs every 6 (six) hours as needed for wheezing or shortness of breath (cough).  . azithromycin (ZITHROMAX) 250 MG tablet Take as package instruction.  . levonorgestrel-ethinyl estradiol (AVIANE,ALESSE,LESSINA) 0.1-20 MG-MCG tablet Take 1 tablet by mouth daily. Take .1 mg tablet each morning. (Patient not taking: Reported on 02/07/2020)   No facility-administered encounter medications on file as of 02/07/2020.    Follow-up: Return in about 3 months (around 05/09/2020).     Rutwik K Patel, MD 

## 2020-02-08 LAB — CBC WITH DIFFERENTIAL/PLATELET
Basophils Absolute: 0 10*3/uL (ref 0.0–0.2)
Basos: 0 %
EOS (ABSOLUTE): 0 10*3/uL (ref 0.0–0.4)
Eos: 1 %
Hematocrit: 43 % (ref 34.0–46.6)
Hemoglobin: 14.2 g/dL (ref 11.1–15.9)
Immature Grans (Abs): 0 10*3/uL (ref 0.0–0.1)
Immature Granulocytes: 0 %
Lymphocytes Absolute: 1.1 10*3/uL (ref 0.7–3.1)
Lymphs: 23 %
MCH: 27.6 pg (ref 26.6–33.0)
MCHC: 33 g/dL (ref 31.5–35.7)
MCV: 84 fL (ref 79–97)
Monocytes Absolute: 0.4 10*3/uL (ref 0.1–0.9)
Monocytes: 9 %
Neutrophils Absolute: 3.2 10*3/uL (ref 1.4–7.0)
Neutrophils: 67 %
Platelets: 287 10*3/uL (ref 150–450)
RBC: 5.15 x10E6/uL (ref 3.77–5.28)
RDW: 12.2 % (ref 11.7–15.4)
WBC: 4.8 10*3/uL (ref 3.4–10.8)

## 2020-02-08 LAB — CMP14+EGFR
ALT: 16 IU/L (ref 0–32)
AST: 20 IU/L (ref 0–40)
Albumin/Globulin Ratio: 1.6 (ref 1.2–2.2)
Albumin: 5 g/dL (ref 3.9–5.0)
Alkaline Phosphatase: 67 IU/L (ref 44–121)
BUN/Creatinine Ratio: 10 (ref 9–23)
BUN: 7 mg/dL (ref 6–20)
Bilirubin Total: 0.4 mg/dL (ref 0.0–1.2)
CO2: 22 mmol/L (ref 20–29)
Calcium: 9.9 mg/dL (ref 8.7–10.2)
Chloride: 102 mmol/L (ref 96–106)
Creatinine, Ser: 0.72 mg/dL (ref 0.57–1.00)
GFR calc Af Amer: 138 mL/min/{1.73_m2} (ref >59)
GFR calc non Af Amer: 120 mL/min/{1.73_m2} (ref >59)
Globulin, Total: 3.2 g/dL (ref 1.5–4.5)
Glucose: 106 mg/dL — ABNORMAL HIGH (ref 65–99)
Potassium: 5 mmol/L (ref 3.5–5.2)
Sodium: 141 mmol/L (ref 134–144)
Total Protein: 8.2 g/dL (ref 6.0–8.5)

## 2020-02-08 LAB — LIPID PANEL
Chol/HDL Ratio: 5.1 ratio — ABNORMAL HIGH (ref 0.0–4.4)
Cholesterol, Total: 228 mg/dL — ABNORMAL HIGH (ref 100–199)
HDL: 45 mg/dL (ref >39)
LDL Chol Calc (NIH): 162 mg/dL — ABNORMAL HIGH (ref 0–99)
Triglycerides: 118 mg/dL (ref 0–149)
VLDL Cholesterol Cal: 21 mg/dL (ref 5–40)

## 2020-02-08 LAB — TSH+FREE T4
Free T4: 1.25 ng/dL (ref 0.82–1.77)
TSH: 1.19 u[IU]/mL (ref 0.450–4.500)

## 2020-02-08 LAB — HEPATITIS C ANTIBODY: Hep C Virus Ab: <0.1 s/co ratio (ref 0.0–0.9)

## 2020-02-08 LAB — VITAMIN D 25 HYDROXY (VIT D DEFICIENCY, FRACTURES): Vit D, 25-Hydroxy: 9.6 ng/mL — ABNORMAL LOW (ref 30.0–100.0)

## 2020-02-08 LAB — HIV ANTIBODY (ROUTINE TESTING W REFLEX): HIV Screen 4th Generation wRfx: NONREACTIVE

## 2020-04-11 ENCOUNTER — Encounter: Payer: Managed Care, Other (non HMO) | Admitting: Adult Health

## 2020-05-09 ENCOUNTER — Ambulatory Visit: Payer: Managed Care, Other (non HMO) | Admitting: Internal Medicine

## 2020-05-10 ENCOUNTER — Encounter: Payer: Self-pay | Admitting: Internal Medicine

## 2020-05-10 ENCOUNTER — Other Ambulatory Visit: Payer: Self-pay

## 2020-05-10 ENCOUNTER — Telehealth (INDEPENDENT_AMBULATORY_CARE_PROVIDER_SITE_OTHER): Payer: Managed Care, Other (non HMO) | Admitting: Internal Medicine

## 2020-05-10 DIAGNOSIS — U071 COVID-19: Secondary | ICD-10-CM

## 2020-05-10 DIAGNOSIS — J4530 Mild persistent asthma, uncomplicated: Secondary | ICD-10-CM

## 2020-05-10 MED ORDER — BENZONATATE 100 MG PO CAPS: 100.0000 mg | ORAL_CAPSULE | Freq: Three times a day (TID) | ORAL | 0 refills | Status: DC

## 2020-05-10 MED ORDER — DEXAMETHASONE 6 MG PO TABS: 6.0000 mg | ORAL_TABLET | Freq: Every day | ORAL | 0 refills | Status: DC

## 2020-05-10 NOTE — Patient Instructions (Signed)
Please start taking Dexamethasone as prescribed.  Please take Tessalon for cough and Tylenol for fever/muscle aches (up to 3 times in a day).  Okay to use Ocean spray/nasal saline spray for nasal congestion.  Please continue to self-quarantine for at least a week from symptom onset or till you don't have fever for 24 hours, whichever is later.

## 2020-05-10 NOTE — Progress Notes (Signed)
Virtual Visit via Telephone Note   This visit type was conducted due to national recommendations for restrictions regarding the COVID-19 Pandemic (e.g. social distancing) in an effort to limit this patient's exposure and mitigate transmission in our community.  Due to her co-morbid illnesses, this patient is at least at moderate risk for complications without adequate follow up.  This format is felt to be most appropriate for this patient at this time.  The patient did not have access to video technology/had technical difficulties with video requiring transitioning to audio format only (telephone).  All issues noted in this document were discussed and addressed.  No physical exam could be performed with this format.   Evaluation Performed:  Follow-up visit  Date:  05/10/2020   ID:  Bianca Mclaughlin, DOB [birth year], MRN [id number]  Patient Location: Home Provider Location: Office/Clinic  Participants: Patient Location of Patient: Home Location of Provider: Telehealth Consent was obtain for visit to be over via telehealth. I verified that I am speaking with the correct person using two identifiers.  PCP:  Anabel Halon, MD   Chief Complaint:  Cough and sore throat  History of Present Illness:    Bianca Mclaughlin is a 20 y.o. female with PMH of asthma who has a televisit for c/o cough and sore throat for last 3-4 days. Cough is dry and associated with mild dyspnea. She also c/o fever and fatigue. She tested for COVID about 2 days ago. She also has been using Albuterol inhaler about 3-4 times per day for dyspnea and wheezing. She has not had COVID vaccine yet.  The patient does have symptoms concerning for COVID-19 infection (fever, chills, cough, or new shortness of breath).   Past Medical, Surgical, Social History, Allergies, and Medications have been Reviewed.  Past Medical History:  Diagnosis Date  . Acne vulgaris 02/20/2015  . Asthma   . Family history of breast cancer in  mother 04/15/2017   Start mammogram at 28.Marland KitchenShe is taking various herbal and vitamin products. Mom was 40  . Seasonal allergies   . Urticaria    History reviewed. No pertinent surgical history.   Current Meds  Medication Sig  . adapalene (DIFFERIN) 0.1 % cream Apply to acne at bedtime as directed.  Marland Kitchen albuterol (PROVENTIL HFA) 108 (90 Base) MCG/ACT inhaler Inhale 2 puffs into the lungs every 6 (six) hours as needed for wheezing or shortness of breath (cough).  . AUVI-Q 0.3 MG/0.3ML SOAJ injection Use as directed for life-threatening allergic reaction.  Marland Kitchen dexamethasone (DECADRON) 6 MG tablet Take 1 tablet (6 mg total) by mouth daily.  . hydrocortisone valerate ointment (WEST-CORT) 0.2 % Apply 1 application topically 2 (two) times daily.  Marland Kitchen levonorgestrel-ethinyl estradiol (AVIANE,ALESSE,LESSINA) 0.1-20 MG-MCG tablet Take 1 tablet by mouth daily. Take .1 mg tablet each morning.  . mometasone (ELOCON) 0.1 % ointment APPLY  OINTMENT TOPICALLY TO ECZEMA ONCE DAILY AFTER  SHOWER  . montelukast (SINGULAIR) 10 MG tablet TAKE 1 TABLET BY MOUTH AT BEDTIME  . triamcinolone ointment (KENALOG) 0.5 % APPLY  OINTMENT TOPICALLY TWICE DAILY     Allergies:   Other, Macadamia nut oil, and Tree extract   ROS:   Please see the history of present illness.     All other systems reviewed and are negative.   Labs/Other Tests and Data Reviewed:    Recent Labs: 02/07/2020: ALT 16; BUN 7; Creatinine, Ser 0.72; Hemoglobin 14.2; Platelets 287; Potassium 5.0; Sodium 141; TSH 1.190   Recent Lipid Panel Lab  Results  Component Value Date/Time   CHOL 228 (H) 02/07/2020 01:49 PM   TRIG 118 02/07/2020 01:49 PM   HDL 45 02/07/2020 01:49 PM   CHOLHDL 5.1 (H) 02/07/2020 01:49 PM   LDLCALC 162 (H) 02/07/2020 01:49 PM    Wt Readings from Last 3 Encounters:  02/07/20 169 lb (76.7 kg)  02/05/20 170 lb (77.1 kg)  01/26/18 173 lb (78.5 kg) (93 %, Z= 1.47)*   * Growth percentiles are based on CDC (Girls, 2-20 Years)  data.      ASSESSMENT & PLAN:    COVID-19 infection Decadron 6 mg QD X 10 days Tessalon PRN for cough Tylenol PRN Nasal saline spray PRN Continue to self-quarantine for at least a week from symptom onset or until afebrile for 24 hours, whichever is later.  Asthma On Albuterol PRN Singulair  Time:   Today, I have spent 12 minutes reviewing the chart, including problem list, medications, and with the patient with telehealth technology discussing the above problems.   Medication Adjustments/Labs and Tests Ordered: Current medicines are reviewed at length with the patient today.  Concerns regarding medicines are outlined above.   Tests Ordered: No orders of the defined types were placed in this encounter.   Medication Changes: Meds ordered this encounter  Medications  . benzonatate (TESSALON) 100 MG capsule    Sig: Take 1 capsule (100 mg total) by mouth every 8 (eight) hours.    Dispense:  30 capsule    Refill:  0  . dexamethasone (DECADRON) 6 MG tablet    Sig: Take 1 tablet (6 mg total) by mouth daily.    Dispense:  10 tablet    Refill:  0     Note: This dictation was prepared with Dragon dictation along with smaller phrase technology. Similar sounding words can be transcribed inadequately or may not be corrected upon review. Any transcriptional errors that result from this process are unintentional.      Disposition:  Follow up  Signed, Anabel Halon, MD  05/10/2020 11:09 AM     [facility]

## 2020-05-30 ENCOUNTER — Ambulatory Visit: Payer: Managed Care, Other (non HMO) | Attending: Internal Medicine

## 2020-05-30 DIAGNOSIS — Z23 Encounter for immunization: Secondary | ICD-10-CM

## 2020-05-30 NOTE — Progress Notes (Signed)
   Covid-19 Vaccination Clinic  Name:  Bianca Mclaughlin    MRN: [id number] DOB: Jul 13, 2000  05/30/2020  [name] was observed post Covid-19 immunization for 15 minutes without incident. She was provided with Vaccine Information Sheet and instruction to access the V-Safe system.   [name] was instructed to call 911 with any severe reactions post vaccine: Marland Kitchen Difficulty breathing  . Swelling of face and throat  . A fast heartbeat  . A bad rash all over body  . Dizziness and weakness   Immunizations Administered    Name Date Dose VIS Date Route   PFIZER Comrnaty(Gray TOP) Covid-19 Vaccine 05/30/2020  3:40 PM 0.3 mL 03/07/2020 Intramuscular   Manufacturer: [employer], [employer]   Lot: [id number]   NDC: [phone]   PFIZER Comrnaty(Gray TOP) Covid-19 Vaccine 05/30/2020  3:50 PM 0.3 mL 03/07/2020 Intramuscular   Manufacturer: [employer], [employer]   Lot: [id number]   NDC: [id number]

## 2020-09-12 ENCOUNTER — Encounter: Payer: Self-pay | Admitting: Internal Medicine

## 2020-09-12 ENCOUNTER — Other Ambulatory Visit: Payer: Self-pay

## 2020-09-12 ENCOUNTER — Ambulatory Visit: Payer: Managed Care, Other (non HMO) | Admitting: Internal Medicine

## 2020-09-12 VITALS — BP 129/83 | HR 89 | Temp 98.4°F | Resp 18 | Ht 64.0 in | Wt 169.4 lb

## 2020-09-12 DIAGNOSIS — E559 Vitamin D deficiency, unspecified: Secondary | ICD-10-CM | POA: Insufficient documentation

## 2020-09-12 DIAGNOSIS — R030 Elevated blood-pressure reading, without diagnosis of hypertension: Secondary | ICD-10-CM

## 2020-09-12 DIAGNOSIS — I10 Essential (primary) hypertension: Secondary | ICD-10-CM | POA: Insufficient documentation

## 2020-09-12 DIAGNOSIS — Z124 Encounter for screening for malignant neoplasm of cervix: Secondary | ICD-10-CM

## 2020-09-12 DIAGNOSIS — Z118 Encounter for screening for other infectious and parasitic diseases: Secondary | ICD-10-CM

## 2020-09-12 DIAGNOSIS — E782 Mixed hyperlipidemia: Secondary | ICD-10-CM

## 2020-09-12 MED ORDER — VITAMIN D (ERGOCALCIFEROL) 1.25 MG (50000 UNIT) PO CAPS: 50000.0000 [IU] | ORAL_CAPSULE | ORAL | 5 refills | Status: DC

## 2020-09-12 NOTE — Assessment & Plan Note (Signed)
Last vitamin D Lab Results  Component Value Date   VD25OH 9.6 (L) 02/07/2020   Started Vitamin D 50,000 IU once weekly

## 2020-09-12 NOTE — Assessment & Plan Note (Signed)
BP better today Advised to follow DASH diet and perform moderate exercise at least 150 mins/week

## 2020-09-12 NOTE — Patient Instructions (Signed)
Please start taking Vitamin D tablet once in a week.  Continue to take other medications as prescribed.  Please follow low cholesterol diet - avoid oily and fried food.

## 2020-09-12 NOTE — Progress Notes (Signed)
Established Patient Office Visit  Subjective:  Patient ID: Bianca Mclaughlin, female    DOB: [birth year]  Age: 20 y.o. MRN: [id number]  CC:  Chief Complaint  Patient presents with   Follow-up    4 month follow up     HPI Bianca Mclaughlin  is a 20 year old female with past medical history of mild persistent asthma, eczema and environmental allergies who presents for follow up of her chronic medical conditions.  She has been doing well overall. She has not started taking Vitamin D supplement yet. She is willing to take Vitamin D 50,000 IU.  She has been trying to follow low cholesterol diet.BP is well-controlled. Patient denies headache, dizziness, chest pain, dyspnea or palpitations.   Past Medical History:  Diagnosis Date   Acne vulgaris 02/20/2015   Asthma    Family history of breast cancer in mother 04/15/2017   Start mammogram at 28.Marland KitchenShe is taking various herbal and vitamin products. Mom was 40   Seasonal allergies    Urticaria     History reviewed. No pertinent surgical history.  Family History  Problem Relation Age of Onset   Hypertension Mother    Breast cancer Mother 74   Allergic rhinitis Father    Eczema Father    Urticaria Father    Hypertension Father    Eczema Sister    Breast cancer Maternal Aunt 50   Hypertension Maternal Aunt    Breast cancer Paternal Aunt 89   Asthma Neg Hx    Immunodeficiency Neg Hx     Social History   Socioeconomic History   Marital status: Single    Spouse name: Not on file   Number of children: Not on file   Years of education: Not on file   Highest education level: Not on file  Occupational History    Comment: [employer]  Tobacco Use   Smoking status: Never   Smokeless tobacco: Never  Vaping Use   Vaping Use: Some days   Substances: Nicotine, Flavoring  Substance and Sexual Activity   Alcohol use: Yes    Alcohol/week: 0.0 standard drinks    Comment: Occasionally   Drug use: No   Sexual activity: Yes    Birth  control/protection: Pill, Condom  Other Topics Concern   Not on file  Social History Narrative   Not on file   Social Determinants of Health   Financial Resource Strain: Not on file  Food Insecurity: Not on file  Transportation Needs: Not on file  Physical Activity: Not on file  Stress: Not on file  Social Connections: Not on file  Intimate Partner Violence: Not on file    Outpatient Medications Prior to Visit  Medication Sig Dispense Refill   adapalene (DIFFERIN) 0.1 % cream Apply to acne at bedtime as directed. 45 g 5   albuterol (PROVENTIL HFA) 108 (90 Base) MCG/ACT inhaler Inhale 2 puffs into the lungs every 6 (six) hours as needed for wheezing or shortness of breath (cough). 8 g 3   AUVI-Q 0.3 MG/0.3ML SOAJ injection Use as directed for life-threatening allergic reaction. 4 Device 3   dexamethasone (DECADRON) 6 MG tablet Take 1 tablet (6 mg total) by mouth daily. 10 tablet 0   hydrocortisone valerate ointment (WEST-CORT) 0.2 % Apply 1 application topically 2 (two) times daily.     mometasone (ELOCON) 0.1 % ointment APPLY  OINTMENT TOPICALLY TO ECZEMA ONCE DAILY AFTER  SHOWER 45 g 0   montelukast (SINGULAIR) 10 MG tablet TAKE 1 TABLET BY  MOUTH AT BEDTIME 30 tablet 0   triamcinolone ointment (KENALOG) 0.5 % APPLY  OINTMENT TOPICALLY TWICE DAILY 30 g 0   levonorgestrel-ethinyl estradiol (AVIANE,ALESSE,LESSINA) 0.1-20 MG-MCG tablet Take 1 tablet by mouth daily. Take .1 mg tablet each morning.     benzonatate (TESSALON) 100 MG capsule Take 1 capsule (100 mg total) by mouth every 8 (eight) hours. (Patient not taking: Reported on 09/12/2020) 30 capsule 0   No facility-administered medications prior to visit.    Allergies  Allergen Reactions   Other Hives, Itching, Rash and Swelling   Macadamia Nut Oil Hives   Tree Extract Hives    ROS Review of Systems  Constitutional:  Negative for chills and fever.  HENT:  Negative for congestion, postnasal drip, sinus pressure, sinus pain  and sore throat.   Eyes:  Negative for pain and discharge.  Respiratory:  Negative for cough and shortness of breath.   Cardiovascular:  Negative for chest pain and palpitations.  Gastrointestinal:  Negative for abdominal pain, constipation, diarrhea, nausea and vomiting.  Endocrine: Negative for polydipsia and polyuria.  Genitourinary:  Negative for dysuria and hematuria.  Musculoskeletal:  Negative for neck pain and neck stiffness.  Skin:  Negative for rash.  Neurological:  Negative for dizziness and weakness.  Psychiatric/Behavioral:  Negative for agitation and behavioral problems.      Objective:    Physical Exam Vitals reviewed.  Constitutional:      General: She is not in acute distress.    Appearance: She is not diaphoretic.  HENT:     Head: Normocephalic and atraumatic.     Nose: Nose normal.     Mouth/Throat:     Mouth: Mucous membranes are moist.  Eyes:     General: No scleral icterus.    Extraocular Movements: Extraocular movements intact.  Cardiovascular:     Rate and Rhythm: Normal rate and regular rhythm.     Pulses: Normal pulses.     Heart sounds: Normal heart sounds. No murmur heard. Pulmonary:     Breath sounds: Normal breath sounds. No wheezing or rales.  Musculoskeletal:     Cervical back: Neck supple. No tenderness.     Right lower leg: No edema.     Left lower leg: No edema.  Skin:    General: Skin is warm.     Findings: No rash.  Neurological:     General: No focal deficit present.     Mental Status: She is alert and oriented to person, place, and time.     Sensory: No sensory deficit.     Motor: No weakness.  Psychiatric:        Mood and Affect: Mood normal.        Behavior: Behavior normal.    BP 129/83 (BP Location: Left Arm, Patient Position: Sitting, Cuff Size: Normal)   Pulse 89   Temp 98.4 F (36.9 C) (Oral)   Resp 18   Ht 5\' 4"  (1.626 m)   Wt 169 lb 6.4 oz (76.8 kg)   SpO2 96%   BMI 29.08 kg/m  Wt Readings from Last 3  Encounters:  09/12/20 169 lb 6.4 oz (76.8 kg)  02/07/20 169 lb (76.7 kg)  02/05/20 170 lb (77.1 kg)     Health Maintenance Due  Topic Date Due   Pneumococcal Vaccine 71-46 Years old (1 - PCV) Never done   HPV VACCINES (1 - 2-dose series) Never done   CHLAMYDIA SCREENING  Never done   TETANUS/TDAP  Never done  PAP-Cervical Cytology Screening  Never done   PAP SMEAR-Modifier  Never done   COVID-19 Vaccine (2 - Pfizer series) 06/20/2020       Topic Date Due   HPV VACCINES (1 - 2-dose series) Never done    Lab Results  Component Value Date   TSH 1.190 02/07/2020   Lab Results  Component Value Date   WBC 4.8 02/07/2020   HGB 14.2 02/07/2020   HCT 43.0 02/07/2020   MCV 84 02/07/2020   PLT 287 02/07/2020   Lab Results  Component Value Date   NA 141 02/07/2020   K 5.0 02/07/2020   CO2 22 02/07/2020   GLUCOSE 106 (H) 02/07/2020   BUN 7 02/07/2020   CREATININE 0.72 02/07/2020   BILITOT 0.4 02/07/2020   ALKPHOS 67 02/07/2020   AST 20 02/07/2020   ALT 16 02/07/2020   PROT 8.2 02/07/2020   ALBUMIN 5.0 02/07/2020   CALCIUM 9.9 02/07/2020   Lab Results  Component Value Date   CHOL 228 (H) 02/07/2020   Lab Results  Component Value Date   HDL 45 02/07/2020   Lab Results  Component Value Date   LDLCALC 162 (H) 02/07/2020   Lab Results  Component Value Date   TRIG 118 02/07/2020   Lab Results  Component Value Date   CHOLHDL 5.1 (H) 02/07/2020   No results found for: HGBA1C    Assessment & Plan:   Problem List Items Addressed This Visit       Other   Prehypertension - Primary    BP better today Advised to follow DASH diet and perform moderate exercise at least 150 mins/week       Relevant Orders   Basic Metabolic Panel (BMET)   Mixed hyperlipidemia    Lipid profile reviewed Follow low cholesterol diet Repeat lipid profile       Relevant Orders   Lipid Profile   Vitamin D deficiency    Last vitamin D Lab Results  Component Value Date    VD25OH 9.6 (L) 02/07/2020  Started Vitamin D 50,000 IU once weekly       Relevant Medications   Vitamin D, Ergocalciferol, (DRISDOL) 1.25 MG (50000 UNIT) CAPS capsule   Other Visit Diagnoses     Encounter for screening examination for chlamydial infection       Relevant Orders   Chlamydia/GC NAA, Confirmation   Routine cervical smear       Relevant Orders   Ambulatory referral to Obstetrics / Gynecology       Meds ordered this encounter  Medications   Vitamin D, Ergocalciferol, (DRISDOL) 1.25 MG (50000 UNIT) CAPS capsule    Sig: Take 1 capsule (50,000 Units total) by mouth every 7 (seven) days.    Dispense:  5 capsule    Refill:  5    Follow-up: Return in about 6 months (around 03/14/2021) for Annual physical.    Anabel Halon, MD

## 2020-09-12 NOTE — Assessment & Plan Note (Signed)
Lipid profile reviewed Follow low cholesterol diet Repeat lipid profile

## 2020-09-13 LAB — BASIC METABOLIC PANEL
BUN/Creatinine Ratio: 10 (ref 9–23)
BUN: 7 mg/dL (ref 6–20)
CO2: 22 mmol/L (ref 20–29)
Calcium: 9.4 mg/dL (ref 8.7–10.2)
Chloride: 101 mmol/L (ref 96–106)
Creatinine, Ser: 0.72 mg/dL (ref 0.57–1.00)
Glucose: 86 mg/dL (ref 65–99)
Potassium: 4.7 mmol/L (ref 3.5–5.2)
Sodium: 137 mmol/L (ref 134–144)
eGFR: 122 mL/min/{1.73_m2} (ref >59)

## 2020-09-13 LAB — LIPID PANEL
Chol/HDL Ratio: 5 ratio — ABNORMAL HIGH (ref 0.0–4.4)
Cholesterol, Total: 203 mg/dL — ABNORMAL HIGH (ref 100–199)
HDL: 41 mg/dL (ref >39)
LDL Chol Calc (NIH): 148 mg/dL — ABNORMAL HIGH (ref 0–99)
Triglycerides: 75 mg/dL (ref 0–149)
VLDL Cholesterol Cal: 14 mg/dL (ref 5–40)

## 2020-09-16 LAB — CHLAMYDIA/GC NAA, CONFIRMATION
Chlamydia trachomatis, NAA: NEGATIVE
Neisseria gonorrhoeae, NAA: NEGATIVE

## 2020-09-18 ENCOUNTER — Encounter: Payer: Self-pay | Admitting: *Deleted

## 2020-09-29 ENCOUNTER — Other Ambulatory Visit: Payer: Self-pay | Admitting: Allergy & Immunology

## 2020-10-01 ENCOUNTER — Encounter: Payer: Self-pay | Admitting: Adult Health

## 2020-10-01 ENCOUNTER — Other Ambulatory Visit: Payer: Self-pay

## 2020-10-01 ENCOUNTER — Ambulatory Visit: Payer: Managed Care, Other (non HMO) | Admitting: Adult Health

## 2020-10-01 ENCOUNTER — Other Ambulatory Visit: Payer: Self-pay | Admitting: Adult Health

## 2020-10-01 VITALS — BP 133/80 | HR 93 | Ht 64.0 in | Wt 171.0 lb

## 2020-10-01 DIAGNOSIS — Z3A01 Less than 8 weeks gestation of pregnancy: Secondary | ICD-10-CM | POA: Diagnosis not present

## 2020-10-01 DIAGNOSIS — Z3201 Encounter for pregnancy test, result positive: Secondary | ICD-10-CM | POA: Diagnosis not present

## 2020-10-01 DIAGNOSIS — O3680X0 Pregnancy with inconclusive fetal viability, not applicable or unspecified: Secondary | ICD-10-CM | POA: Diagnosis not present

## 2020-10-01 LAB — POCT URINE PREGNANCY: Preg Test, Ur: POSITIVE — AB

## 2020-10-01 NOTE — Progress Notes (Signed)
Patient ID: Bianca Mclaughlin, female   DOB: 05-20-2000, 20 y.o.   MRN: [id number] History of Present Illness:  Bianca Mclaughlin is a 20 year old black female,single, in for UPT has missed a period and had 3+HPTs. PCP is Dr Allena Katz.   Current Medications, Allergies, Past Medical History, Past Surgical History, Family History and Social History were reviewed in [employer] record.     Review of Systems:  +missed period and had 3+HPTs Some nausea Reviewed past medical,surgical, social and family history. Reviewed medications and allergies.    Physical Exam:BP 133/80 (BP Location: Left Arm, Patient Position: Sitting, Cuff Size: Normal)   Pulse 93   Ht 5\' 4"  (1.626 m)   Wt 171 lb (77.6 kg)   LMP 08/23/2020   BMI 29.35 kg/m  UPT +,about 5+4 weeks by LMP with EDD 05/30/21. General:  Well developed, well nourished, no acute distress Skin:  Warm and dry Neck:  Midline trachea, normal thyroid, good ROM, no lymphadenopathy Lungs; Clear to auscultation bilaterally Cardiovascular: Regular rate and rhythm Abdomen:  Soft, non tender, no hepatosplenomegaly Psych:  No mood changes, alert and cooperative,seems happy AA is 1 Fall risk is low Depression screen [facility] 2/9 10/01/2020 09/12/2020 05/10/2020  Decreased Interest 0 0 0  Down, Depressed, Hopeless 0 0 0  PHQ - 2 Score 0 0 0  Altered sleeping 1 - -  Tired, decreased energy 1 - -  Change in appetite 1 - -  Feeling bad or failure about yourself  0 - -  Trouble concentrating 0 - -  Moving slowly or fidgety/restless 0 - -  Suicidal thoughts 0 - -  PHQ-9 Score 3 - -  Difficult doing work/chores - - -    GAD 7 : Generalized Anxiety Score 10/01/2020  Nervous, Anxious, on Edge 0  Control/stop worrying 0  Worry too much - different things 0  Trouble relaxing 0  Restless 0  Easily annoyed or irritable 1  Afraid - awful might happen 0  Total GAD 7 Score 1      Upstream - 10/01/20 1209       Pregnancy Intention Screening   Does the  patient want to become pregnant in the next year? N/A    Does the patient's partner want to become pregnant in the next year? N/A    Would the patient like to discuss contraceptive options today? N/A      Contraception Wrap Up   Current Method Pregnant/Seeking Pregnancy    End Method Pregnant/Seeking Pregnancy    Contraception Counseling Provided No              Impression and Plan: 1. Pregnancy examination or test, positive result Get OTC Prenatal gummie  2. Less than [redacted] weeks gestation of pregnancy Eat often to help with nausea, declines meds  Review handout by [facility]   3. Encounter to determine fetal viability of pregnancy, single or unspecified fetus Return in 3 weeks for dating 1210

## 2020-10-01 NOTE — Addendum Note (Signed)
Addended by: Cyril Mourning A on: 10/01/2020 12:33 PM   Modules accepted: Orders

## 2020-10-22 ENCOUNTER — Encounter: Payer: Managed Care, Other (non HMO) | Admitting: Adult Health

## 2020-10-22 ENCOUNTER — Ambulatory Visit (INDEPENDENT_AMBULATORY_CARE_PROVIDER_SITE_OTHER): Payer: Managed Care, Other (non HMO)

## 2020-10-22 ENCOUNTER — Other Ambulatory Visit: Payer: Self-pay

## 2020-10-22 DIAGNOSIS — O3680X0 Pregnancy with inconclusive fetal viability, not applicable or unspecified: Secondary | ICD-10-CM | POA: Diagnosis not present

## 2020-10-22 DIAGNOSIS — Z3A08 8 weeks gestation of pregnancy: Secondary | ICD-10-CM

## 2020-10-22 NOTE — Progress Notes (Signed)
[place] 8+4 wks,single IUP with ys,positive FHT 166 bpm,normal ovaries,CRL 22.16 mm

## 2020-11-06 ENCOUNTER — Telehealth: Payer: Self-pay | Admitting: Adult Health

## 2020-11-06 MED ORDER — PROMETHAZINE HCL 25 MG PO TABS: 25.0000 mg | ORAL_TABLET | Freq: Four times a day (QID) | ORAL | 1 refills | Status: DC | PRN

## 2020-11-06 NOTE — Telephone Encounter (Signed)
Patient Called stating that she is feeling a lot of nausea and she would like for [place] to call her in something for it, patient is Pregnant. Patient states she uses [facility]. Please contact pt when medication has been called in.

## 2020-11-06 NOTE — Addendum Note (Signed)
Addended by: Cyril Mourning A on: 11/06/2020 01:30 PM   Modules accepted: Orders

## 2020-11-06 NOTE — Telephone Encounter (Signed)
Pt aware that meds sent to [facility], rx phenergan

## 2020-11-19 ENCOUNTER — Other Ambulatory Visit: Payer: Self-pay | Admitting: Obstetrics & Gynecology

## 2020-11-19 DIAGNOSIS — Z3682 Encounter for antenatal screening for nuchal translucency: Secondary | ICD-10-CM

## 2020-11-20 ENCOUNTER — Other Ambulatory Visit (HOSPITAL_COMMUNITY)
Admission: RE | Admit: 2020-11-20 | Discharge: 2020-11-20 | Disposition: A | Discharge status: Home / Self Care | Payer: Managed Care, Other (non HMO) | Source: Ambulatory Visit | Attending: Advanced Practice Midwife | Admitting: Advanced Practice Midwife

## 2020-11-20 ENCOUNTER — Ambulatory Visit (INDEPENDENT_AMBULATORY_CARE_PROVIDER_SITE_OTHER): Payer: Managed Care, Other (non HMO) | Admitting: Advanced Practice Midwife

## 2020-11-20 ENCOUNTER — Ambulatory Visit (INDEPENDENT_AMBULATORY_CARE_PROVIDER_SITE_OTHER): Payer: Managed Care, Other (non HMO)

## 2020-11-20 ENCOUNTER — Ambulatory Visit: Payer: Managed Care, Other (non HMO) | Admitting: *Deleted

## 2020-11-20 ENCOUNTER — Encounter: Payer: Self-pay | Admitting: Advanced Practice Midwife

## 2020-11-20 ENCOUNTER — Other Ambulatory Visit: Payer: Self-pay

## 2020-11-20 VITALS — BP 121/79 | HR 88 | Wt 172.0 lb

## 2020-11-20 DIAGNOSIS — Z113 Encounter for screening for infections with a predominantly sexual mode of transmission: Secondary | ICD-10-CM | POA: Insufficient documentation

## 2020-11-20 DIAGNOSIS — Z3401 Encounter for supervision of normal first pregnancy, first trimester: Secondary | ICD-10-CM

## 2020-11-20 DIAGNOSIS — Z3A12 12 weeks gestation of pregnancy: Secondary | ICD-10-CM

## 2020-11-20 DIAGNOSIS — Z3682 Encounter for antenatal screening for nuchal translucency: Secondary | ICD-10-CM

## 2020-11-20 DIAGNOSIS — Z124 Encounter for screening for malignant neoplasm of cervix: Secondary | ICD-10-CM | POA: Diagnosis present

## 2020-11-20 DIAGNOSIS — Z3400 Encounter for supervision of normal first pregnancy, unspecified trimester: Secondary | ICD-10-CM | POA: Insufficient documentation

## 2020-11-20 DIAGNOSIS — O0990 Supervision of high risk pregnancy, unspecified, unspecified trimester: Secondary | ICD-10-CM | POA: Insufficient documentation

## 2020-11-20 LAB — POCT URINALYSIS DIPSTICK OB
Blood, UA: NEGATIVE
Glucose, UA: NEGATIVE
Ketones, UA: NEGATIVE
Leukocytes, UA: NEGATIVE
Nitrite, UA: NEGATIVE
POC,PROTEIN,UA: NEGATIVE

## 2020-11-20 LAB — OB RESULTS CONSOLE GC/CHLAMYDIA: Gonorrhea: NEGATIVE

## 2020-11-20 NOTE — Progress Notes (Signed)
INITIAL OBSTETRICAL VISIT Patient name: Bianca Mclaughlin MRN [id number]  Date of birth: 01-May-2000 Chief Complaint:   Initial Prenatal Visit (Nt/it)  History of Present Illness:   Bianca Mclaughlin is a 20 y.o. G33P0000 African-American female at [redacted]w[redacted]d by LMP c/w u/s at 8.4 weeks with an Estimated Date of Delivery: 05/30/21 being seen today for her initial obstetrical visit.   Patient's last menstrual period was 08/23/2020. Her obstetrical history is significant for primigravida.   Today she reports  having some nausea, but thinks is improving .  Last pap never.   Depression screen [facility] 2/9 11/20/2020 10/01/2020 09/12/2020 05/10/2020 02/07/2020  Decreased Interest 0 0 0 0 0  Down, Depressed, Hopeless 0 0 0 0 0  PHQ - 2 Score 0 0 0 0 0  Altered sleeping 1 1 - - -  Tired, decreased energy 1 1 - - -  Change in appetite 0 1 - - -  Feeling bad or failure about yourself  0 0 - - -  Trouble concentrating 1 0 - - -  Moving slowly or fidgety/restless 0 0 - - -  Suicidal thoughts 0 0 - - -  PHQ-9 Score 3 3 - - -  Difficult doing work/chores - - - - -     GAD 7 : Generalized Anxiety Score 11/20/2020 10/01/2020  Nervous, Anxious, on Edge 0 0  Control/stop worrying 0 0  Worry too much - different things 1 0  Trouble relaxing 0 0  Restless 0 0  Easily annoyed or irritable 1 1  Afraid - awful might happen 0 0  Total GAD 7 Score 2 1     Review of Systems:   Pertinent items are noted in HPI Denies cramping/contractions, leakage of fluid, vaginal bleeding, abnormal vaginal discharge w/ itching/odor/irritation, headaches, visual changes, shortness of breath, chest pain, abdominal pain, severe nausea/vomiting, or problems with urination or bowel movements unless otherwise stated above.  Pertinent History Reviewed:  Reviewed past medical,surgical, social, obstetrical and family history.  Reviewed problem list, medications and allergies. OB History  Gravida Para Term Preterm AB Living  1 0 0 0 0 0  SAB  IAB Ectopic Multiple Live Births  0 0 0 0 0    # Outcome Date GA Lbr Len/2nd Weight Sex Delivery Anes PTL Lv  1 Current            Physical Assessment:   Vitals:   11/20/20 1341  BP: 121/79  Pulse: 88  Weight: 172 lb (78 kg)  Body mass index is 29.52 kg/m.       Physical Examination:  General appearance - well appearing, and in no distress  Mental status - alert, oriented to person, place, and time  Psych:  She has a normal Mclaughlin and affect  Skin - warm and dry, normal color, no suspicious lesions noted  Chest - effort normal, all lung fields clear to auscultation bilaterally  Heart - normal rate and regular rhythm  Abdomen - soft, nontender  Extremities:  No swelling or varicosities noted  Pelvic - VULVA: normal appearing vulva with no masses, tenderness or lesions  VAGINA: normal appearing vagina with normal color and discharge, no lesions  CERVIX: normal appearing cervix without discharge or lesions, no CMT  Thin prep pap is done without HR HPV cotesting  Chaperone: Bianca Mclaughlin    TODAY'S NT (unable to be obtained) [place] 12+5 wks,measurements c/w dates,fhr 164 bpm,normal ovaries,NB present,CRL 73.59 mm,unable to visualized NT and cord insertion because of  fetal position  Results for orders placed or performed in visit on 11/20/20 (from the past 24 hour(s))  POC Urinalysis Dipstick OB   Collection Time: 11/20/20  2:46 PM  Result Value Ref Range   Color, UA     Clarity, UA     Glucose, UA Negative Negative   Bilirubin, UA     Ketones, UA neg    Spec Grav, UA     Blood, UA neg    pH, UA     POC,PROTEIN,UA Negative Negative, Trace, Small (1+), Moderate (2+), Large (3+), 4+   Urobilinogen, UA     Nitrite, UA neg    Leukocytes, UA Negative Negative   Appearance     Odor      Assessment & Plan:  1) Low-Risk Pregnancy G1P0000 at [redacted]w[redacted]d with an Estimated Date of Delivery: 05/30/21   2) Initial OB visit   Meds: No orders of the defined types were placed in this  encounter.   Initial labs obtained Continue prenatal vitamins Reviewed n/v relief measures and warning s/s to report Reviewed recommended weight gain based on pre-gravid BMI Encouraged well-balanced diet Genetic & carrier screening discussed: requests Panorama and Horizon , undecided about AFP (wanted NT/IT, but unable to obtain NT measurement) Ultrasound discussed; fetal survey: requested CCNC completed> form faxed if has or is planning to apply for medicaid The nature of [facility] [employer] with multiple MDs and other Advanced Practice Providers was explained to patient; also emphasized that fellows, residents, and students are part of our team. Does not have home bp cuff. Office bp cuff given: yes. Rx sent: n/a. Check bp weekly, let us know if consistently >140/90.   No indications for ASA therapy or early HgbAic (per uptodate)   Follow-up: Return in about 4 weeks (around 12/18/2020) for LROB, in person.   Orders Placed This Encounter  Procedures   Urine Culture   Pain Management Screening Profile (10S)   Genetic Screening   CBC/D/Plt+RPR+Rh+ABO+RubIgG...   POC Urinalysis Dipstick OB    Bianca Mclaughlin [facility] 11/20/2020 3:04 PM

## 2020-11-20 NOTE — Progress Notes (Signed)
[place] 12+5 wks,measurements c/w dates,fhr 164 bpm,normal ovaries,NB present,CRL 73.59 mm,unable to visualized NT and cord insertion because of fetal position

## 2020-11-20 NOTE — Patient Instructions (Signed)
Bianca Mclaughlin, thank you for choosing our office today! We appreciate the opportunity to meet your healthcare needs. You may receive a short survey by mail, e-mail, or through MyChart. If you are happy with your care we would appreciate if you could take just a few minutes to complete the survey questions. We read all of your comments and take your feedback very seriously. Thank you again for choosing our office.  [facility] ([address] [place], [place] [address]) Entrance C, located off of [address] Free 24/7 valet parking   Nausea & Vomiting Have saltine crackers or pretzels by your bed and eat a few bites before you raise your head out of bed in the morning Eat small frequent meals throughout the day instead of large meals Drink plenty of fluids throughout the day to stay hydrated, just don't drink a lot of fluids with your meals.  This can make your stomach fill up faster making you feel sick Do not brush your teeth right after you eat Products with real ginger are good for nausea, like ginger ale and ginger hard candy Make sure it says made with real ginger! Sucking on sour candy like lemon heads is also good for nausea If your prenatal vitamins make you nauseated, take them at night so you will sleep through the nausea Sea Bands If you feel like you need medicine for the nausea & vomiting please let us know If you are unable to keep any fluids or food down please let us know   Constipation Drink plenty of fluid, preferably water, throughout the day Eat foods high in fiber such as fruits, vegetables, and grains Exercise, such as walking, is a good way to keep your bowels regular Drink warm fluids, especially warm prune juice, or decaf coffee Eat a 1/2 cup of real oatmeal (not instant), 1/2 cup applesauce, and 1/2-1 cup warm prune juice every day If needed, you may take Colace (docusate sodium) stool softener  once or twice a day to help keep the stool soft.  If you still are having problems with constipation, you may take Miralax once daily as needed to help keep your bowels regular.   Home Blood Pressure Monitoring for Patients   Your provider has recommended that you check your blood pressure (BP) at least once a week at home. If you do not have a blood pressure cuff at home, one will be provided for you. Contact your provider if you have not received your monitor within 1 week.   Helpful Tips for Accurate Home Blood Pressure Checks  Don't smoke, exercise, or drink caffeine 30 minutes before checking your BP Use the restroom before checking your BP (a full bladder can raise your pressure) Relax in a comfortable upright chair Feet on the ground Left arm resting comfortably on a flat surface at the level of your heart Legs uncrossed Back supported Sit quietly and don't talk Place the cuff on your bare arm Adjust snuggly, so that only two fingertips can fit between your skin and the top of the cuff Check 2 readings separated by at least one minute Keep a log of your BP readings For a visual, please reference this diagram: http://ccnc.care/bpdiagram  Provider Name: [facility]     Phone: [phone]  Zone 1: ALL CLEAR  Continue to monitor your symptoms:  BP reading is less than 140 (top number) or less than 90 (bottom   number)  No right upper stomach pain No headaches or seeing spots No feeling nauseated or throwing up No swelling in face and hands  Zone 2: CAUTION Call your doctor's office for any of the following:  BP reading is greater than 140 (top number) or greater than 90 (bottom number)  Stomach pain under your ribs in the middle or right side Headaches or seeing spots Feeling nauseated or throwing up Swelling in face and hands  Zone 3: EMERGENCY  Seek immediate medical care if you have any of the following:  BP reading is greater than160 (top number) or greater than  110 (bottom number) Severe headaches not improving with Tylenol Serious difficulty catching your breath Any worsening symptoms from Zone 2    First Trimester of Pregnancy The first trimester of pregnancy is from week 1 until the end of week 12 (months 1 through 3). A week after a sperm fertilizes an egg, the egg will implant on the wall of the uterus. This embryo will begin to develop into a baby. Genes from you and your partner are forming the baby. The female genes determine whether the baby is a boy or a girl. At 6-8 weeks, the eyes and face are formed, and the heartbeat can be seen on ultrasound. At the end of 12 weeks, all the baby's organs are formed.  Now that you are pregnant, you will want to do everything you can to have a healthy baby. Two of the most important things are to get good prenatal care and to follow your health care provider's instructions. Prenatal care is all the medical care you receive before the baby's birth. This care will help prevent, find, and treat any problems during the pregnancy and childbirth. BODY CHANGES Your body goes through many changes during pregnancy. The changes vary from woman to woman.  You may gain or lose a couple of pounds at first. You may feel sick to your stomach (nauseous) and throw up (vomit). If the vomiting is uncontrollable, call your health care provider. You may tire easily. You may develop headaches that can be relieved by medicines approved by your health care provider. You may urinate more often. Painful urination may mean you have a bladder infection. You may develop heartburn as a result of your pregnancy. You may develop constipation because certain hormones are causing the muscles that push waste through your intestines to slow down. You may develop hemorrhoids or swollen, bulging veins (varicose veins). Your breasts may begin to grow larger and become tender. Your nipples may stick out more, and the tissue that surrounds them  (areola) may become darker. Your gums may bleed and may be sensitive to brushing and flossing. Dark spots or blotches (chloasma, mask of pregnancy) may develop on your face. This will likely fade after the baby is born. Your menstrual periods will stop. You may have a loss of appetite. You may develop cravings for certain kinds of food. You may have changes in your emotions from day to day, such as being excited to be pregnant or being concerned that something may go wrong with the pregnancy and baby. You may have more vivid and strange dreams. You may have changes in your hair. These can include thickening of your hair, rapid growth, and changes in texture. Some women also have hair loss during or after pregnancy, or hair that feels dry or thin. Your hair will most likely return to normal after your baby is born. WHAT TO EXPECT AT YOUR PRENATAL  VISITS During a routine prenatal visit: You will be weighed to make sure you and the baby are growing normally. Your blood pressure will be taken. Your abdomen will be measured to track your baby's growth. The fetal heartbeat will be listened to starting around week 10 or 12 of your pregnancy. Test results from any previous visits will be discussed. Your health care provider may ask you: How you are feeling. If you are feeling the baby move. If you have had any abnormal symptoms, such as leaking fluid, bleeding, severe headaches, or abdominal cramping. If you have any questions. Other tests that may be performed during your first trimester include: Blood tests to find your blood type and to check for the presence of any previous infections. They will also be used to check for low iron levels (anemia) and Rh antibodies. Later in the pregnancy, blood tests for diabetes will be done along with other tests if problems develop. Urine tests to check for infections, diabetes, or protein in the urine. An ultrasound to confirm the proper growth and development  of the baby. An amniocentesis to check for possible genetic problems. Fetal screens for spina bifida and Down syndrome. You may need other tests to make sure you and the baby are doing well. HOME CARE INSTRUCTIONS  Medicines Follow your health care provider's instructions regarding medicine use. Specific medicines may be either safe or unsafe to take during pregnancy. Take your prenatal vitamins as directed. If you develop constipation, try taking a stool softener if your health care provider approves. Diet Eat regular, well-balanced meals. Choose a variety of foods, such as meat or vegetable-based protein, fish, milk and low-fat dairy products, vegetables, fruits, and whole grain breads and cereals. Your health care provider will help you determine the amount of weight gain that is right for you. Avoid raw meat and uncooked cheese. These carry germs that can cause birth defects in the baby. Eating four or five small meals rather than three large meals a day may help relieve nausea and vomiting. If you start to feel nauseous, eating a few soda crackers can be helpful. Drinking liquids between meals instead of during meals also seems to help nausea and vomiting. If you develop constipation, eat more high-fiber foods, such as fresh vegetables or fruit and whole grains. Drink enough fluids to keep your urine clear or pale yellow. Activity and Exercise Exercise only as directed by your health care provider. Exercising will help you: Control your weight. Stay in shape. Be prepared for labor and delivery. Experiencing pain or cramping in the lower abdomen or low back is a good sign that you should stop exercising. Check with your health care provider before continuing normal exercises. Try to avoid standing for long periods of time. Move your legs often if you must stand in one place for a long time. Avoid heavy lifting. Wear low-heeled shoes, and practice good posture. You may continue to have sex  unless your health care provider directs you otherwise. Relief of Pain or Discomfort Wear a good support bra for breast tenderness.   Take warm sitz baths to soothe any pain or discomfort caused by hemorrhoids. Use hemorrhoid cream if your health care provider approves.   Rest with your legs elevated if you have leg cramps or low back pain. If you develop varicose veins in your legs, wear support hose. Elevate your feet for 15 minutes, 3-4 times a day. Limit salt in your diet. Prenatal Care Schedule your prenatal visits by the  twelfth week of pregnancy. They are usually scheduled monthly at first, then more often in the last 2 months before delivery. Write down your questions. Take them to your prenatal visits. Keep all your prenatal visits as directed by your health care provider. Safety Wear your seat belt at all times when driving. Make a list of emergency phone numbers, including numbers for family, friends, the hospital, and police and fire departments. General Tips Ask your health care provider for a referral to a local prenatal education class. Begin classes no later than at the beginning of month 6 of your pregnancy. Ask for help if you have counseling or nutritional needs during pregnancy. Your health care provider can offer advice or refer you to specialists for help with various needs. Do not use hot tubs, steam rooms, or saunas. Do not douche or use tampons or scented sanitary pads. Do not cross your legs for long periods of time. Avoid cat litter boxes and soil used by cats. These carry germs that can cause birth defects in the baby and possibly loss of the fetus by miscarriage or stillbirth. Avoid all smoking, herbs, alcohol, and medicines not prescribed by your health care provider. Chemicals in these affect the formation and growth of the baby. Schedule a dentist appointment. At home, brush your teeth with a soft toothbrush and be gentle when you floss. SEEK MEDICAL CARE IF:   You have dizziness. You have mild pelvic cramps, pelvic pressure, or nagging pain in the abdominal area. You have persistent nausea, vomiting, or diarrhea. You have a bad smelling vaginal discharge. You have pain with urination. You notice increased swelling in your face, hands, legs, or ankles. SEEK IMMEDIATE MEDICAL CARE IF:  You have a fever. You are leaking fluid from your vagina. You have spotting or bleeding from your vagina. You have severe abdominal cramping or pain. You have rapid weight gain or loss. You vomit blood or material that looks like coffee grounds. You are exposed to [place] measles and have never had them. You are exposed to fifth disease or chickenpox. You develop a severe headache. You have shortness of breath. You have any kind of trauma, such as from a fall or a car accident. Document Released: 03/10/2001 Document Revised: 07/31/2013 Document Reviewed: 01/24/2013 [facility] Patient Information 2015 [place], [place]. This information is not intended to replace advice given to you by your health care provider. Make sure you discuss any questions you have with your health care provider.

## 2020-11-21 LAB — CBC/D/PLT+RPR+RH+ABO+RUBIGG...
Antibody Screen: NEGATIVE
Basophils Absolute: 0 10*3/uL (ref 0.0–0.2)
Basos: 0 %
EOS (ABSOLUTE): 0.1 10*3/uL (ref 0.0–0.4)
Eos: 2 %
HCV Ab: <0.1 s/co ratio (ref 0.0–0.9)
HIV Screen 4th Generation wRfx: NONREACTIVE
Hematocrit: 36.4 % (ref 34.0–46.6)
Hemoglobin: 12.4 g/dL (ref 11.1–15.9)
Hepatitis B Surface Ag: NEGATIVE
Immature Grans (Abs): 0 10*3/uL (ref 0.0–0.1)
Immature Granulocytes: 1 %
Lymphocytes Absolute: 1 10*3/uL (ref 0.7–3.1)
Lymphs: 23 %
MCH: 27.7 pg (ref 26.6–33.0)
MCHC: 34.1 g/dL (ref 31.5–35.7)
MCV: 81 fL (ref 79–97)
Monocytes Absolute: 0.5 10*3/uL (ref 0.1–0.9)
Monocytes: 11 %
Neutrophils Absolute: 2.8 10*3/uL (ref 1.4–7.0)
Neutrophils: 63 %
Platelets: 231 10*3/uL (ref 150–450)
RBC: 4.47 x10E6/uL (ref 3.77–5.28)
RDW: 12.4 % (ref 11.7–15.4)
RPR Ser Ql: NONREACTIVE
Rh Factor: POSITIVE
Rubella Antibodies, IGG: >33 index (ref >0.99)
WBC: 4.4 10*3/uL (ref 3.4–10.8)

## 2020-11-21 LAB — PMP SCREEN PROFILE (10S), URINE
Amphetamine Scrn, Ur: NEGATIVE ng/mL
BARBITURATE SCREEN URINE: NEGATIVE ng/mL
BENZODIAZEPINE SCREEN, URINE: NEGATIVE ng/mL
CANNABINOIDS UR QL SCN: NEGATIVE ng/mL
Cocaine (Metab) Scrn, Ur: NEGATIVE ng/mL
Creatinine(Crt), U: 170.5 mg/dL (ref 20.0–300.0)
Methadone Screen, Urine: NEGATIVE ng/mL
OXYCODONE+OXYMORPHONE UR QL SCN: NEGATIVE ng/mL
Opiate Scrn, Ur: NEGATIVE ng/mL
Ph of Urine: 7.1 (ref 4.5–8.9)
Phencyclidine Qn, Ur: NEGATIVE ng/mL
Propoxyphene Scrn, Ur: NEGATIVE ng/mL

## 2020-11-21 LAB — HCV INTERPRETATION

## 2020-11-22 LAB — CYTOLOGY - PAP
Chlamydia: NEGATIVE
Comment: NEGATIVE
Comment: NORMAL
Diagnosis: NEGATIVE
Neisseria Gonorrhea: NEGATIVE

## 2020-11-22 LAB — URINE CULTURE

## 2020-12-03 ENCOUNTER — Encounter: Payer: Self-pay | Admitting: Women's Health

## 2020-12-03 DIAGNOSIS — Z148 Genetic carrier of other disease: Secondary | ICD-10-CM | POA: Insufficient documentation

## 2020-12-05 ENCOUNTER — Encounter: Payer: Self-pay | Admitting: Advanced Practice Midwife

## 2020-12-18 ENCOUNTER — Other Ambulatory Visit: Payer: Self-pay

## 2020-12-18 ENCOUNTER — Ambulatory Visit (INDEPENDENT_AMBULATORY_CARE_PROVIDER_SITE_OTHER): Payer: Managed Care, Other (non HMO) | Admitting: Advanced Practice Midwife

## 2020-12-18 VITALS — BP 136/83 | HR 107 | Wt 176.0 lb

## 2020-12-18 DIAGNOSIS — O162 Unspecified maternal hypertension, second trimester: Secondary | ICD-10-CM

## 2020-12-18 DIAGNOSIS — Z3A16 16 weeks gestation of pregnancy: Secondary | ICD-10-CM

## 2020-12-18 DIAGNOSIS — Z363 Encounter for antenatal screening for malformations: Secondary | ICD-10-CM

## 2020-12-18 DIAGNOSIS — Z3402 Encounter for supervision of normal first pregnancy, second trimester: Secondary | ICD-10-CM

## 2020-12-18 LAB — POCT URINALYSIS DIPSTICK OB
Blood, UA: NEGATIVE
Glucose, UA: NEGATIVE
Ketones, UA: NEGATIVE
Leukocytes, UA: NEGATIVE
Nitrite, UA: NEGATIVE
POC,PROTEIN,UA: NEGATIVE

## 2020-12-18 MED ORDER — ASPIRIN 81 MG PO CHEW: 162.0000 mg | CHEWABLE_TABLET | Freq: Every day | ORAL | 7 refills | Status: DC

## 2020-12-18 NOTE — Progress Notes (Signed)
LOW-RISK PREGNANCY VISIT Patient name: Bianca Mclaughlin MRN [id number]  Date of birth: [birth year] Chief Complaint:   Routine Prenatal Visit  History of Present Illness:   Bianca Mclaughlin is a 20 y.o. G30P0000 female at 1w5dwith an Estimated Date of Delivery: 05/30/21 being seen today for ongoing management of a low-risk pregnancy.  Today she reports  occ cramping . Contractions: Not present. Vag. Bleeding: None.  Movement: Absent. denies leaking of fluid. Review of Systems:   Pertinent items are noted in HPI Denies abnormal vaginal discharge w/ itching/odor/irritation, headaches, visual changes, shortness of breath, chest pain, abdominal pain, severe nausea/vomiting, or problems with urination or bowel movements unless otherwise stated above. Pertinent History Reviewed:  Reviewed past medical,surgical, social, obstetrical and family history.  Reviewed problem list, medications and allergies. Physical Assessment:   Vitals:   12/18/20 1428 12/18/20 1436  BP: (!) 142/92 136/83  Pulse: (!) 105 (!) 107  Weight: 176 lb (79.8 kg)   Body mass index is 30.21 kg/m.        Physical Examination:   General appearance: Well appearing, and in no distress  Mental status: Alert, oriented to person, place, and time  Skin: Warm & dry  Cardiovascular: Normal heart rate noted  Respiratory: Normal respiratory effort, no distress  Abdomen: Soft, gravid, nontender  Pelvic: Cervical exam deferred         Extremities: Edema: None  Fetal Status: Fetal Heart Rate (bpm): 156   Movement: Absent    Results for orders placed or performed in visit on 12/18/20 (from the past 24 hour(s))  POC Urinalysis Dipstick OB   Collection Time: 12/18/20  2:36 PM  Result Value Ref Range   Color, UA     Clarity, UA     Glucose, UA Negative Negative   Bilirubin, UA     Ketones, UA neg    Spec Grav, UA     Blood, UA neg    pH, UA     POC,PROTEIN,UA Negative Negative, Trace, Small (1+), Moderate (2+), Large (3+), 4+    Urobilinogen, UA     Nitrite, UA neg    Leukocytes, UA Negative Negative   Appearance     Odor      Assessment & Plan:  1) Low-risk pregnancy G1P0000 at 133w5dith an Estimated Date of Delivery: 05/30/21   2) Elevated BP today, rx bASA [id number] and P/C ratio; will continue to watch her BP values  3) +SMA carrier, FOB to get tested- pt has been in touch with the lab for his testing   Meds:  Meds ordered this encounter  Medications   aspirin 81 MG chewable tablet    Sig: Chew 2 tablets (162 mg total) by mouth daily.    Dispense:  60 tablet    Refill:  7    Order Specific Question:   Supervising Provider    Answer:   EURFlorian [id number]]   Labs/procedures today: CMET, P/C ratio  Plan:  Continue routine obstetrical care   Reviewed: Preterm labor symptoms and general obstetric precautions including but not limited to vaginal bleeding, contractions, leaking of fluid and fetal movement were reviewed in detail with the patient.  All questions were answered. Has home bp cuff. Check bp weekly, let us [place]ow if >140/90.   Follow-up: Return for 3-4 weeks; LROB, anatomy u/s.  Orders Placed This Encounter  Procedures   US [place] Comp + 14 Wk   Comp Met (CMET)   Protein / creatinine ratio, urine  POC Urinalysis Dipstick OB   Myrtis Ser [facility] 12/18/2020 5:14 PM

## 2020-12-18 NOTE — Patient Instructions (Signed)
Bianca Mclaughlin, thank you for choosing our office today! We appreciate the opportunity to meet your healthcare needs. You may receive a short survey by mail, e-mail, or through MyChart. If you are happy with your care we would appreciate if you could take just a few minutes to complete the survey questions. We read all of your comments and take your feedback very seriously. Thank you again for choosing our office.  [facility] Team at [facility] ([address] [place], [place] [address]) Entrance C, located off of [address] Free 24/7 valet parking  Go to [email or web address] to register for FREE online childbirth classes  Call the office ([phone]) or go to [facility] if: You begin to severe cramping Your water breaks.  Sometimes it is a big gush of fluid, sometimes it is just a trickle that keeps getting your panties wet or running down your legs You have vaginal bleeding.  It is normal to have a small amount of spotting if your cervix was checked.   [place] Pediatric[facility]/Family Doctors [facility] ([facility]): [address], [phone]           [facility]: [address], [phone]                [facility] ([facility]): [address], [phone] (call to ask if accepting patients) [facility]: [address], [place], [phone]    [place] Pediatricians/Family Doctors [facility] ([facility]): [address], [phone] [facility]: [address], [phone] [facility]: [address], [phone]  [place] [facility]  [facility] ([facility]): [phone] [facility]: [address], [phone]   [place] [facility]: [address], [phone]  [place] [facility]  [facility]: [address], [phone]  Home Blood Pressure Monitoring for Patients   Your provider has recommended that you check your blood pressure (BP) at least once a week at home. If you do not have a blood pressure cuff at home, one will be provided for you. Contact your provider if you have not received your monitor within 1 week.   Helpful Tips for Accurate Home Blood Pressure Checks  Don't smoke, exercise, or drink caffeine 30 minutes before checking your BP Use the restroom before checking your BP (a full bladder can raise your pressure) Relax in a comfortable upright chair Feet on the ground Left arm resting comfortably on a flat surface at the level of your heart Legs uncrossed Back supported Sit quietly and don't talk Place the cuff on your bare arm Adjust snuggly, so that only two fingertips can fit between your skin and the top of the cuff Check 2 readings separated by at least one minute Keep a log of your BP readings For a visual, please reference this diagram: http://ccnc.care/bpdiagram  Provider Name: [facility]     Phone: [phone]  Zone 1: ALL CLEAR  Continue to monitor your symptoms:  BP reading is less than 140 (top number) or less than 90 (bottom number)  No right upper stomach pain No headaches or seeing spots No feeling nauseated or throwing up No swelling in face and hands  Zone 2: CAUTION Call your doctor's office for any of the following:  BP reading is greater than 140 (top number) or greater than   90 (bottom number)  Stomach pain under your ribs in the middle or right side Headaches or seeing spots Feeling nauseated or throwing up Swelling in face and hands  Zone 3: EMERGENCY  Seek immediate medical care if you have any of the following:  BP reading is greater than160 (top number) or greater than 110 (bottom number) Severe headaches not improving with Tylenol Serious difficulty catching your breath Any worsening symptoms from  Zone 2     Second Trimester of Pregnancy The second trimester is from week 14 through week 27 (months 4 through 6). The second trimester is often a time when you feel your best. Your body has adjusted to being pregnant, and you begin to feel better physically. Usually, morning sickness has lessened or quit completely, you may have more energy, and you may have an increase in appetite. The second trimester is also a time when the fetus is growing rapidly. At the end of the sixth month, the fetus is about 9 inches long and weighs about 1 pounds. You will likely begin to feel the baby move (quickening) between 16 and 20 weeks of pregnancy. Body changes during your second trimester Your body continues to go through many changes during your second trimester. The changes vary from woman to woman. Your weight will continue to increase. You will notice your lower abdomen bulging out. You may begin to get stretch marks on your hips, abdomen, and breasts. You may develop headaches that can be relieved by medicines. The medicines should be approved by your health care provider. You may urinate more often because the fetus is pressing on your bladder. You may develop or continue to have heartburn as a result of your pregnancy. You may develop constipation because certain hormones are causing the muscles that push waste through your intestines to slow down. You may develop hemorrhoids or swollen, bulging veins (varicose veins). You may have back pain. This is caused by: Weight gain. Pregnancy hormones that are relaxing the joints in your pelvis. A shift in weight and the muscles that support your balance. Your breasts will continue to grow and they will continue to become tender. Your gums may bleed and may be sensitive to brushing and flossing. Dark spots or blotches (chloasma, mask of pregnancy) may develop on your face. This will likely fade after the baby is born. A dark line from your belly button to  the pubic area (linea nigra) may appear. This will likely fade after the baby is born. You may have changes in your hair. These can include thickening of your hair, rapid growth, and changes in texture. Some women also have hair loss during or after pregnancy, or hair that feels dry or thin. Your hair will most likely return to normal after your baby is born.  What to expect at prenatal visits During a routine prenatal visit: You will be weighed to make sure you and the fetus are growing normally. Your blood pressure will be taken. Your abdomen will be measured to track your baby's growth. The fetal heartbeat will be listened to. Any test results from the previous visit will be discussed.  Your health care provider may ask you: How you are feeling. If you are feeling the baby move. If you have had any abnormal symptoms, such as leaking fluid, bleeding, severe headaches, or abdominal cramping. If you are using any tobacco products, including cigarettes, chewing tobacco, and electronic cigarettes. If you have any questions.  Other tests that may be performed during   your second trimester include: Blood tests that check for: Low iron levels (anemia). High blood sugar that affects pregnant women (gestational diabetes) between 24 and 28 weeks. Rh antibodies. This is to check for a protein on red blood cells (Rh factor). Urine tests to check for infections, diabetes, or protein in the urine. An ultrasound to confirm the proper growth and development of the baby. An amniocentesis to check for possible genetic problems. Fetal screens for spina bifida and Down syndrome. HIV (human immunodeficiency virus) testing. Routine prenatal testing includes screening for HIV, unless you choose not to have this test.  Follow these instructions at home: Medicines Follow your health care provider's instructions regarding medicine use. Specific medicines may be either safe or unsafe to take during  pregnancy. Take a prenatal vitamin that contains at least 600 micrograms (mcg) of folic acid. If you develop constipation, try taking a stool softener if your health care provider approves. Eating and drinking Eat a balanced diet that includes fresh fruits and vegetables, whole grains, good sources of protein such as meat, eggs, or tofu, and low-fat dairy. Your health care provider will help you determine the amount of weight gain that is right for you. Avoid raw meat and uncooked cheese. These carry germs that can cause birth defects in the baby. If you have low calcium intake from food, talk to your health care provider about whether you should take a daily calcium supplement. Limit foods that are high in fat and processed sugars, such as fried and sweet foods. To prevent constipation: Drink enough fluid to keep your urine clear or pale yellow. Eat foods that are high in fiber, such as fresh fruits and vegetables, whole grains, and beans. Activity Exercise only as directed by your health care provider. Most women can continue their usual exercise routine during pregnancy. Try to exercise for 30 minutes at least 5 days a week. Stop exercising if you experience uterine contractions. Avoid heavy lifting, wear low heel shoes, and practice good posture. A sexual relationship may be continued unless your health care provider directs you otherwise. Relieving pain and discomfort Wear a good support bra to prevent discomfort from breast tenderness. Take warm sitz baths to soothe any pain or discomfort caused by hemorrhoids. Use hemorrhoid cream if your health care provider approves. Rest with your legs elevated if you have leg cramps or low back pain. If you develop varicose veins, wear support hose. Elevate your feet for 15 minutes, 3-4 times a day. Limit salt in your diet. Prenatal Care Write down your questions. Take them to your prenatal visits. Keep all your prenatal visits as told by your health  care provider. This is important. Safety Wear your seat belt at all times when driving. Make a list of emergency phone numbers, including numbers for family, friends, the hospital, and police and fire departments. General instructions Ask your health care provider for a referral to a local prenatal education class. Begin classes no later than the beginning of month 6 of your pregnancy. Ask for help if you have counseling or nutritional needs during pregnancy. Your health care provider can offer advice or refer you to specialists for help with various needs. Do not use hot tubs, steam rooms, or saunas. Do not douche or use tampons or scented sanitary pads. Do not cross your legs for long periods of time. Avoid cat litter boxes and soil used by cats. These carry germs that can cause birth defects in the baby and possibly loss of the   fetus by miscarriage or stillbirth. Avoid all smoking, herbs, alcohol, and unprescribed drugs. Chemicals in these products can affect the formation and growth of the baby. Do not use any products that contain nicotine or tobacco, such as cigarettes and e-cigarettes. If you need help quitting, ask your health care provider. Visit your dentist if you have not gone yet during your pregnancy. Use a soft toothbrush to brush your teeth and be gentle when you floss. Contact a health care provider if: You have dizziness. You have mild pelvic cramps, pelvic pressure, or nagging pain in the abdominal area. You have persistent nausea, vomiting, or diarrhea. You have a bad smelling vaginal discharge. You have pain when you urinate. Get help right away if: You have a fever. You are leaking fluid from your vagina. You have spotting or bleeding from your vagina. You have severe abdominal cramping or pain. You have rapid weight gain or weight loss. You have shortness of breath with chest pain. You notice sudden or extreme swelling of your face, hands, ankles, feet, or legs. You  have not felt your baby move in over an hour. You have severe headaches that do not go away when you take medicine. You have vision changes. Summary The second trimester is from week 14 through week 27 (months 4 through 6). It is also a time when the fetus is growing rapidly. Your body goes through many changes during pregnancy. The changes vary from woman to woman. Avoid all smoking, herbs, alcohol, and unprescribed drugs. These chemicals affect the formation and growth your baby. Do not use any tobacco products, such as cigarettes, chewing tobacco, and e-cigarettes. If you need help quitting, ask your health care provider. Contact your health care provider if you have any questions. Keep all prenatal visits as told by your health care provider. This is important. This information is not intended to replace advice given to you by your health care provider. Make sure you discuss any questions you have with your health care provider. Document Released: 03/10/2001 Document Revised: 08/22/2015 Document Reviewed: 05/17/2012 Elsevier Interactive Patient Education  2017 [employer]  

## 2020-12-19 LAB — COMPREHENSIVE METABOLIC PANEL
ALT: 10 IU/L (ref 0–32)
AST: 14 IU/L (ref 0–40)
Albumin/Globulin Ratio: 1.4 (ref 1.2–2.2)
Albumin: 3.9 g/dL (ref 3.9–5.0)
Alkaline Phosphatase: 53 IU/L (ref 44–121)
BUN/Creatinine Ratio: 10 (ref 9–23)
BUN: 5 mg/dL — ABNORMAL LOW (ref 6–20)
Bilirubin Total: 0.3 mg/dL (ref 0.0–1.2)
CO2: 20 mmol/L (ref 20–29)
Calcium: 8.8 mg/dL (ref 8.7–10.2)
Chloride: 103 mmol/L (ref 96–106)
Creatinine, Ser: 0.48 mg/dL — ABNORMAL LOW (ref 0.57–1.00)
Globulin, Total: 2.8 g/dL (ref 1.5–4.5)
Glucose: 110 mg/dL — ABNORMAL HIGH (ref 65–99)
Potassium: 3.9 mmol/L (ref 3.5–5.2)
Sodium: 137 mmol/L (ref 134–144)
Total Protein: 6.7 g/dL (ref 6.0–8.5)
eGFR: 137 mL/min/{1.73_m2} (ref >59)

## 2020-12-19 LAB — PROTEIN / CREATININE RATIO, URINE
Creatinine, Urine: 98.6 mg/dL
Protein, Ur: 7.6 mg/dL
Protein/Creat Ratio: 77 mg/g creat (ref 0–200)

## 2020-12-20 ENCOUNTER — Encounter: Payer: Self-pay | Admitting: Advanced Practice Midwife

## 2021-01-15 ENCOUNTER — Encounter: Payer: Self-pay | Admitting: Advanced Practice Midwife

## 2021-01-15 ENCOUNTER — Other Ambulatory Visit: Payer: Self-pay

## 2021-01-15 ENCOUNTER — Ambulatory Visit (INDEPENDENT_AMBULATORY_CARE_PROVIDER_SITE_OTHER): Payer: Managed Care, Other (non HMO)

## 2021-01-15 ENCOUNTER — Ambulatory Visit (INDEPENDENT_AMBULATORY_CARE_PROVIDER_SITE_OTHER): Payer: Managed Care, Other (non HMO) | Admitting: Advanced Practice Midwife

## 2021-01-15 VITALS — BP 141/83 | HR 102 | Wt 182.0 lb

## 2021-01-15 DIAGNOSIS — O162 Unspecified maternal hypertension, second trimester: Secondary | ICD-10-CM

## 2021-01-15 DIAGNOSIS — Z3A20 20 weeks gestation of pregnancy: Secondary | ICD-10-CM

## 2021-01-15 DIAGNOSIS — O0990 Supervision of high risk pregnancy, unspecified, unspecified trimester: Secondary | ICD-10-CM

## 2021-01-15 DIAGNOSIS — O10919 Unspecified pre-existing hypertension complicating pregnancy, unspecified trimester: Secondary | ICD-10-CM

## 2021-01-15 DIAGNOSIS — I10 Essential (primary) hypertension: Secondary | ICD-10-CM

## 2021-01-15 DIAGNOSIS — Z363 Encounter for antenatal screening for malformations: Secondary | ICD-10-CM

## 2021-01-15 DIAGNOSIS — Z3402 Encounter for supervision of normal first pregnancy, second trimester: Secondary | ICD-10-CM

## 2021-01-15 MED ORDER — TRIAMCINOLONE ACETONIDE 0.5 % EX OINT
1.0000 | TOPICAL_OINTMENT | Freq: Two times a day (BID) | CUTANEOUS | 2 refills | Status: DC
Start: 2021-01-15 — End: 2021-05-10

## 2021-01-15 NOTE — Progress Notes (Signed)
HIGH-RISK PREGNANCY VISIT Patient name: Bianca Mclaughlin MRN [id number]  Date of birth: 2000-10-08 Chief Complaint:   Routine Prenatal Visit (Anatomy scan)  History of Present Illness:   Bianca Mclaughlin is a 20 y.o. G11P0000 female at [redacted]w[redacted]d with an Estimated Date of Delivery: 05/30/21 being seen today for ongoing management of a high-risk pregnancy complicated by chronic hypertension currently on no meds, diagnosed with mild range values today and at 16wks and prehypertensive dx by PCP.  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 Contractions: Not present. Vag. Bleeding: None.  Movement: Present. denies leaking of fluid.   Depression screen [facility] 2/9 11/20/2020 10/01/2020 09/12/2020 05/10/2020 02/07/2020  Decreased Interest 0 0 0 0 0  Down, Depressed, Hopeless 0 0 0 0 0  PHQ - 2 Score 0 0 0 0 0  Altered sleeping 1 1 - - -  Tired, decreased energy 1 1 - - -  Change in appetite 0 1 - - -  Feeling bad or failure about yourself  0 0 - - -  Trouble concentrating 1 0 - - -  Moving slowly or fidgety/restless 0 0 - - -  Suicidal thoughts 0 0 - - -  PHQ-9 Score 3 3 - - -  Difficult doing work/chores - - - - -     GAD 7 : Generalized Anxiety Score 11/20/2020 10/01/2020  Nervous, Anxious, on Edge 0 0  Control/stop worrying 0 0  Worry too much - different things 1 0  Trouble relaxing 0 0  Restless 0 0  Easily annoyed or irritable 1 1  Afraid - awful might happen 0 0  Total GAD 7 Score 2 1     Review of Systems:   Pertinent items are noted in HPI Denies abnormal vaginal discharge w/ itching/odor/irritation, headaches, visual changes, shortness of breath, chest pain, abdominal pain, severe nausea/vomiting, or problems with urination or bowel movements unless otherwise stated above. Pertinent History Reviewed:  Reviewed past medical,surgical, social, obstetrical and family history.  Reviewed problem list, medications and allergies. Physical Assessment:   Vitals:   01/15/21  1600  BP: (!) 141/83  Pulse: (!) 102  Weight: 182 lb (82.6 kg)  Body mass index is 31.24 kg/m.           Physical Examination:   General appearance: alert, well appearing, and in no distress  Mental status: alert, oriented to person, place, and time  Skin: warm & dry   Extremities: Edema: None    Cardiovascular: normal heart rate noted  Respiratory: normal respiratory effort, no distress  Abdomen: gravid, soft, non-tender  Pelvic: Cervical exam deferred         Fetal Status: Fetal Heart Rate (bpm): 146 u/s   Movement: Present    Fetal Surveillance Testing today (anatomy scan): [place] 20+5 wks,cephalic,posterior placenta gr 0,normal ovaries,cx 3 cm,svp of fluid 4.3 cm,EFW 427 g 82%,limited view of face,please have pt come back after appt today,no obvious abnormalities    No results found for this or any previous visit (from the past 24 hour(s)).  Assessment & Plan:  High-risk pregnancy: G1P0000 at [redacted]w[redacted]d with an Estimated Date of Delivery: 05/30/21   1) cHTN, diagnosed today based on elevated value at 16wks and PCP previous dx of prehypertensive; taking bASA 162mg  already; will get growth scans q 4wks, no ante testing as long as no meds; IOL 38-39.6wks  2) Ezcema R wrist looked at it and rec Kenalog bid then daily as it  improves; stay away from lotions/detergents with scent  Meds:  Meds ordered this encounter  Medications   triamcinolone ointment (KENALOG) 0.5 %    Sig: Apply 1 application topically 2 (two) times daily.    Dispense:  30 g    Refill:  2    Order Specific Question:   Supervising Provider    Answer:   Myna Hidalgo [[id number]]     Labs/procedures today: U/S  Treatment Plan:  growth q 4wks; no ante testing if no meds; IOL 38-39.6wks  Reviewed: Preterm labor symptoms and general obstetric precautions including but not limited to vaginal bleeding, contractions, leaking of fluid and fetal movement were reviewed in detail with the patient.  All questions were  answered. Does have home bp cuff. Office bp cuff given: not applicable. Check bp weekly, let us know if consistently >150 and/or >95.  Follow-up: Return in about 4 weeks (around 02/12/2021) for HROB, [place]: EFW, in person.   Future Appointments  Date Time Provider Department Center  03/14/2021 11:00 AM Anabel Halon, MD [facility]    Orders Placed This Encounter  Procedures   US OB Follow Up   Arabella Merles [facility] 01/15/2021 4:25 PM

## 2021-01-15 NOTE — Patient Instructions (Signed)
[place], thank you for choosing our office today! We appreciate the opportunity to meet your healthcare needs. You may receive a short survey by mail, e-mail, or through MyChart. If you are happy with your care we would appreciate if you could take just a few minutes to complete the survey questions. We read all of your comments and take your feedback very seriously. Thank you again for choosing our office.  [facility] Team at [facility] ([address] [place], [place] [address]) Entrance C, located off of [address] Free 24/7 valet parking  Go to [email or web address] to register for FREE online childbirth classes  Call the office ([phone]) or go to [facility] if: You begin to severe cramping Your water breaks.  Sometimes it is a big gush of fluid, sometimes it is just a trickle that keeps getting your panties wet or running down your legs You have vaginal bleeding.  It is normal to have a small amount of spotting if your cervix was checked.   [place] Pediatricians/Family Doctors [facility] ([facility]): [address], [phone]           [facility]: [address], [phone]                [facility] ([facility]): [address], [phone] (call to ask if accepting patients) [facility]: [address], [place], [phone]    [place] Pediatricians/Family Doctors [facility] ([facility]): [address], [phone] [facility]: [address], [phone] [facility]: [address], [phone]  [place] [facility]  [facility] ([facility]): [phone] [facility]: [address], [phone]   [place] Family Doctors [facility]: [address], [phone]  [place] [facility]  [facility]: [address], [phone]  Home Blood Pressure Monitoring for Patients   Your provider has recommended that you check your blood pressure (BP) at least once a week at home. If you do not have a blood pressure cuff at home, one will be provided for you. Contact your provider if you have not received your monitor within 1 week.   Helpful Tips for Accurate Home Blood Pressure Checks  Don't smoke, exercise, or drink caffeine 30 minutes before checking your BP Use the restroom before checking your BP (a full bladder can raise your pressure) Relax in a comfortable upright chair Feet on the ground Left arm resting comfortably on a flat surface at the level of your heart Legs uncrossed Back supported Sit quietly and don't talk Place the cuff on your bare arm Adjust snuggly, so that only two fingertips can fit between your skin and the top of the cuff Check 2 readings separated by at least one minute Keep a log of your BP readings For a visual, please reference this diagram: http://[facility].care/bpdiagram  Provider Name: [facility]     Phone: [phone]  Zone 1: ALL CLEAR  Continue to monitor your symptoms:  BP reading is less than 140 (top number) or less than 90 (bottom number)  No right upper stomach pain No headaches or seeing spots No feeling nauseated or throwing up No swelling in face and hands  Zone 2: CAUTION Call your doctor's office for any of the following:  BP reading is greater than 140 (top number) or greater than   90 (bottom number)  Stomach pain under your ribs in the middle or right side Headaches or seeing spots Feeling nauseated or throwing up Swelling in face and hands  Zone 3: EMERGENCY  Seek immediate medical care if you have any of the following:  BP reading is greater than160 (top number) or greater than 110 (bottom number) Severe headaches not improving with Tylenol Serious difficulty catching your breath Any worsening symptoms from  Zone 2     Second Trimester of Pregnancy The second trimester is from week 14 through week 27 (months 4 through 6). The second trimester is often a time when you feel your best. Your body has adjusted to being pregnant, and you begin to feel better physically. Usually, morning sickness has lessened or quit completely, you may have more energy, and you may have an increase in appetite. The second trimester is also a time when the fetus is growing rapidly. At the end of the sixth month, the fetus is about 9 inches long and weighs about 1 pounds. You will likely begin to feel the baby move (quickening) between 16 and 20 weeks of pregnancy. Body changes during your second trimester Your body continues to go through many changes during your second trimester. The changes vary from woman to woman. Your weight will continue to increase. You will notice your lower abdomen bulging out. You may begin to get stretch marks on your hips, abdomen, and breasts. You may develop headaches that can be relieved by medicines. The medicines should be approved by your health care provider. You may urinate more often because the fetus is pressing on your bladder. You may develop or continue to have heartburn as a result of your pregnancy. You may develop constipation because certain hormones are causing the muscles that push waste through your intestines to slow down. You may develop hemorrhoids or swollen, bulging veins (varicose veins). You may have back pain. This is caused by: Weight gain. Pregnancy hormones that are relaxing the joints in your pelvis. A shift in weight and the muscles that support your balance. Your breasts will continue to grow and they will continue to become tender. Your gums may bleed and may be sensitive to brushing and flossing. Dark spots or blotches (chloasma, mask of pregnancy) may develop on your face. This will likely fade after the baby is born. A dark line from your belly button to  the pubic area (linea nigra) may appear. This will likely fade after the baby is born. You may have changes in your hair. These can include thickening of your hair, rapid growth, and changes in texture. Some women also have hair loss during or after pregnancy, or hair that feels dry or thin. Your hair will most likely return to normal after your baby is born.  What to expect at prenatal visits During a routine prenatal visit: You will be weighed to make sure you and the fetus are growing normally. Your blood pressure will be taken. Your abdomen will be measured to track your baby's growth. The fetal heartbeat will be listened to. Any test results from the previous visit will be discussed.  Your health care provider may ask you: How you are feeling. If you are feeling the baby move. If you have had any abnormal symptoms, such as leaking fluid, bleeding, severe headaches, or abdominal cramping. If you are using any tobacco products, including cigarettes, chewing tobacco, and electronic cigarettes. If you have any questions.  Other tests that may be performed during   your second trimester include: Blood tests that check for: Low iron levels (anemia). High blood sugar that affects pregnant women (gestational diabetes) between 24 and 28 weeks. Rh antibodies. This is to check for a protein on red blood cells (Rh factor). Urine tests to check for infections, diabetes, or protein in the urine. An ultrasound to confirm the proper growth and development of the baby. An amniocentesis to check for possible genetic problems. Fetal screens for spina bifida and Down syndrome. HIV (human immunodeficiency virus) testing. Routine prenatal testing includes screening for HIV, unless you choose not to have this test.  Follow these instructions at home: Medicines Follow your health care provider's instructions regarding medicine use. Specific medicines may be either safe or unsafe to take during  pregnancy. Take a prenatal vitamin that contains at least 600 micrograms (mcg) of folic acid. If you develop constipation, try taking a stool softener if your health care provider approves. Eating and drinking Eat a balanced diet that includes fresh fruits and vegetables, whole grains, good sources of protein such as meat, eggs, or tofu, and low-fat dairy. Your health care provider will help you determine the amount of weight gain that is right for you. Avoid raw meat and uncooked cheese. These carry germs that can cause birth defects in the baby. If you have low calcium intake from food, talk to your health care provider about whether you should take a daily calcium supplement. Limit foods that are high in fat and processed sugars, such as fried and sweet foods. To prevent constipation: Drink enough fluid to keep your urine clear or pale yellow. Eat foods that are high in fiber, such as fresh fruits and vegetables, whole grains, and beans. Activity Exercise only as directed by your health care provider. Most women can continue their usual exercise routine during pregnancy. Try to exercise for 30 minutes at least 5 days a week. Stop exercising if you experience uterine contractions. Avoid heavy lifting, wear low heel shoes, and practice good posture. A sexual relationship may be continued unless your health care provider directs you otherwise. Relieving pain and discomfort Wear a good support bra to prevent discomfort from breast tenderness. Take warm sitz baths to soothe any pain or discomfort caused by hemorrhoids. Use hemorrhoid cream if your health care provider approves. Rest with your legs elevated if you have leg cramps or low back pain. If you develop varicose veins, wear support hose. Elevate your feet for 15 minutes, 3-4 times a day. Limit salt in your diet. Prenatal Care Write down your questions. Take them to your prenatal visits. Keep all your prenatal visits as told by your health  care provider. This is important. Safety Wear your seat belt at all times when driving. Make a list of emergency phone numbers, including numbers for family, friends, the hospital, and police and fire departments. General instructions Ask your health care provider for a referral to a local prenatal education class. Begin classes no later than the beginning of month 6 of your pregnancy. Ask for help if you have counseling or nutritional needs during pregnancy. Your health care provider can offer advice or refer you to specialists for help with various needs. Do not use hot tubs, steam rooms, or saunas. Do not douche or use tampons or scented sanitary pads. Do not cross your legs for long periods of time. Avoid cat litter boxes and soil used by cats. These carry germs that can cause birth defects in the baby and possibly loss of the   fetus by miscarriage or stillbirth. Avoid all smoking, herbs, alcohol, and unprescribed drugs. Chemicals in these products can affect the formation and growth of the baby. Do not use any products that contain nicotine or tobacco, such as cigarettes and e-cigarettes. If you need help quitting, ask your health care provider. Visit your dentist if you have not gone yet during your pregnancy. Use a soft toothbrush to brush your teeth and be gentle when you floss. Contact a health care provider if: You have dizziness. You have mild pelvic cramps, pelvic pressure, or nagging pain in the abdominal area. You have persistent nausea, vomiting, or diarrhea. You have a bad smelling vaginal discharge. You have pain when you urinate. Get help right away if: You have a fever. You are leaking fluid from your vagina. You have spotting or bleeding from your vagina. You have severe abdominal cramping or pain. You have rapid weight gain or weight loss. You have shortness of breath with chest pain. You notice sudden or extreme swelling of your face, hands, ankles, feet, or legs. You  have not felt your baby move in over an hour. You have severe headaches that do not go away when you take medicine. You have vision changes. Summary The second trimester is from week 14 through week 27 (months 4 through 6). It is also a time when the fetus is growing rapidly. Your body goes through many changes during pregnancy. The changes vary from woman to woman. Avoid all smoking, herbs, alcohol, and unprescribed drugs. These chemicals affect the formation and growth your baby. Do not use any tobacco products, such as cigarettes, chewing tobacco, and e-cigarettes. If you need help quitting, ask your health care provider. Contact your health care provider if you have any questions. Keep all prenatal visits as told by your health care provider. This is important. This information is not intended to replace advice given to you by your health care provider. Make sure you discuss any questions you have with your health care provider. Document Released: 03/10/2001 Document Revised: 08/22/2015 Document Reviewed: 05/17/2012 Elsevier Interactive Patient Education  2017 [employer] Inc.  

## 2021-01-15 NOTE — Progress Notes (Signed)
[place] 20+5 wks,cephalic,posterior placenta gr 0,normal ovaries,cx 3 cm,svp of fluid 4.3 cm,EFW 427 g 82%,limited view of face,please have pt come back after appt today,no obvious abnormalities

## 2021-01-22 ENCOUNTER — Other Ambulatory Visit: Payer: Self-pay

## 2021-01-22 ENCOUNTER — Encounter (HOSPITAL_COMMUNITY): Payer: Self-pay | Admitting: Obstetrics and Gynecology

## 2021-01-22 ENCOUNTER — Inpatient Hospital Stay (HOSPITAL_COMMUNITY)
Admission: AD | Admit: 2021-01-22 | Discharge: 2021-01-23 | Disposition: A | Discharge status: Home / Self Care | Payer: Managed Care, Other (non HMO) | Attending: Obstetrics and Gynecology | Admitting: Obstetrics and Gynecology

## 2021-01-22 DIAGNOSIS — I10 Essential (primary) hypertension: Secondary | ICD-10-CM

## 2021-01-22 DIAGNOSIS — E860 Dehydration: Secondary | ICD-10-CM

## 2021-01-22 DIAGNOSIS — O219 Vomiting of pregnancy, unspecified: Secondary | ICD-10-CM | POA: Diagnosis not present

## 2021-01-22 DIAGNOSIS — Z3A21 21 weeks gestation of pregnancy: Secondary | ICD-10-CM

## 2021-01-22 DIAGNOSIS — Z87891 Personal history of nicotine dependence: Secondary | ICD-10-CM | POA: Insufficient documentation

## 2021-01-22 DIAGNOSIS — Z7982 Long term (current) use of aspirin: Secondary | ICD-10-CM | POA: Insufficient documentation

## 2021-01-22 DIAGNOSIS — O212 Late vomiting of pregnancy: Secondary | ICD-10-CM | POA: Diagnosis not present

## 2021-01-22 DIAGNOSIS — R112 Nausea with vomiting, unspecified: Secondary | ICD-10-CM

## 2021-01-22 DIAGNOSIS — O0990 Supervision of high risk pregnancy, unspecified, unspecified trimester: Secondary | ICD-10-CM

## 2021-01-22 LAB — URINALYSIS, ROUTINE W REFLEX MICROSCOPIC
Bilirubin Urine: NEGATIVE
Glucose, UA: NEGATIVE mg/dL
Hgb urine dipstick: NEGATIVE
Ketones, ur: NEGATIVE mg/dL
Leukocytes,Ua: NEGATIVE
Nitrite: NEGATIVE
Protein, ur: NEGATIVE mg/dL
Specific Gravity, Urine: 1.018 (ref 1.005–1.030)
pH: 6 (ref 5.0–8.0)

## 2021-01-22 LAB — CBC
HCT: 34.5 % — ABNORMAL LOW (ref 36.0–46.0)
Hemoglobin: 11.4 g/dL — ABNORMAL LOW (ref 12.0–15.0)
MCH: 27.9 pg (ref 26.0–34.0)
MCHC: 33 g/dL (ref 30.0–36.0)
MCV: 84.4 fL (ref 80.0–100.0)
Platelets: 226 10*3/uL (ref 150–400)
RBC: 4.09 MIL/uL (ref 3.87–5.11)
RDW: 12.9 % (ref 11.5–15.5)
WBC: 6.4 10*3/uL (ref 4.0–10.5)
nRBC: 0 % (ref 0.0–0.2)

## 2021-01-22 MED ORDER — SODIUM CHLORIDE 0.9 % IV SOLN
25.0000 mg | Freq: Once | INTRAVENOUS | Status: AC
  Administered 2021-01-22: 25 mg via INTRAVENOUS
  Filled 2021-01-22: qty 1

## 2021-01-22 NOTE — MAU Provider Note (Signed)
Chief Complaint:  Fatigue   Event Date/Time   First Provider Initiated Contact with Patient 01/22/21 2259     HPI: Bianca Mclaughlin is a 20 y.o. G1P0000 at [id number] presents to maternity admissions reporting fatigue, nausea and vomiting.  No fever or chills No URI/sore throat symptoms.  No one else is sick. . She reports good fetal movement, denies LOF, vaginal bleeding, vaginal itching/burning, urinary symptoms, h/a, dizziness, diarrhea, constipation or fever/chills.  .  Emesis  This is a new problem. The current episode started today. The problem occurs 2 to 4 times per day. There has been no fever. Pertinent negatives include no abdominal pain, chills, coughing, diarrhea, dizziness, fever, headaches, myalgias or URI. She has tried nothing for the symptoms.   RN Note: Having a lot of fatigue today. About lunch time I had a lot of nausea and vomiting. Slept the rest of the day. Ate something tonight and then vomited. Also can't keep down liquids. Had n/v early in preg but had stopped for awhile. Denies VB or LOF. No diarrhea  Past Medical History: Past Medical History:  Diagnosis Date   Acne vulgaris 02/20/2015   Asthma    Family history of breast cancer in mother 04/15/2017   Start mammogram at 30.Marland KitchenShe is taking various herbal and vitamin products. Mom was 40   High cholesterol    Seasonal allergies    Urticaria     Past obstetric history: OB History  Gravida Para Term Preterm AB Living  1 0 0 0 0 0  SAB IAB Ectopic Multiple Live Births  0 0 0 0 0    # Outcome Date GA Lbr Len/2nd Weight Sex Delivery Anes PTL Lv  1 Current             Past Surgical History: Past Surgical History:  Procedure Laterality Date   NO PAST SURGERIES      Family History: Family History  Problem Relation Age of Onset   Hypertension Mother    Breast cancer Mother 17   Allergic rhinitis Father    Eczema Father    Urticaria Father    Hypertension Father    Eczema Sister    Breast cancer  Maternal Aunt 50   Hypertension Maternal Aunt    Breast cancer Paternal Aunt 61   Asthma Neg Hx    Immunodeficiency Neg Hx     Social History: Social History   Tobacco Use   Smoking status: Never   Smokeless tobacco: Never  Vaping Use   Vaping Use: Former   Substances: Nicotine, Flavoring  Substance Use Topics   Alcohol use: Not Currently    Comment: Occasionally   Drug use: No    Allergies:  Allergies  Allergen Reactions   Other Hives, Itching, Swelling and Rash    Cat dander   Macadamia Nut Oil Hives   Tree Extract Hives    Meds:  Medications Prior to Admission  Medication Sig Dispense Refill Last Dose   aspirin 81 MG chewable tablet Chew 2 tablets (162 mg total) by mouth daily. 60 tablet 7 01/22/2021   Prenatal Vit-Fe Fumarate-FA (PRENATAL VITAMIN PO) Take by mouth.   01/22/2021   triamcinolone ointment (KENALOG) 0.5 % Apply 1 application topically 2 (two) times daily. 30 g 2 01/22/2021   promethazine (PHENERGAN) 25 MG tablet Take 1 tablet (25 mg total) by mouth every 6 (six) hours as needed for nausea or vomiting. (Patient not taking: No sig reported) 30 tablet 1     I  have reviewed patient's Past Medical Hx, Surgical Hx, Family Hx, Social Hx, medications and allergies.   ROS:  Review of Systems  Constitutional:  Negative for chills and fever.  Respiratory:  Negative for cough.   Gastrointestinal:  Positive for vomiting. Negative for abdominal pain and diarrhea.  Musculoskeletal:  Negative for myalgias.  Neurological:  Negative for dizziness and headaches.  Other systems negative  Physical Exam  Patient Vitals for the past 24 hrs:  BP Temp Pulse Resp SpO2 Height Weight  01/22/21 2246 (!) 141/68 98.4 F (36.9 C) 85 17 100 % 5\' 3"  (1.6 m) 79.8 kg   Constitutional: Well-developed, well-nourished female in no acute distress.  Cardiovascular: normal rate and rhythm Respiratory: normal effort GI: Abd soft, non-tender, gravid appropriate for gestational age.    No rebound or guarding. MS: Extremities nontender, no edema, normal ROM Neurologic: Alert and oriented x 4.  GU: Neg CVAT.   FHT:  150   Labs: Results for orders placed or performed during the hospital encounter of 01/22/21 (from the past 24 hour(s))  Urinalysis, Routine w reflex microscopic Urine, Clean Catch     Status: Abnormal   Collection Time: 01/22/21 10:53 PM  Result Value Ref Range   Color, Urine YELLOW YELLOW   APPearance HAZY (A) CLEAR   Specific Gravity, Urine 1.018 1.005 - 1.030   pH 6.0 5.0 - 8.0   Glucose, UA NEGATIVE NEGATIVE mg/dL   Hgb urine dipstick NEGATIVE NEGATIVE   Bilirubin Urine NEGATIVE NEGATIVE   Ketones, ur NEGATIVE NEGATIVE mg/dL   Protein, ur NEGATIVE NEGATIVE mg/dL   Nitrite NEGATIVE NEGATIVE   Leukocytes,Ua NEGATIVE NEGATIVE  CBC     Status: Abnormal   Collection Time: 01/22/21 11:41 PM  Result Value Ref Range   WBC 6.4 4.0 - 10.5 K/uL   RBC 4.09 3.87 - 5.11 MIL/uL   Hemoglobin 11.4 (L) 12.0 - 15.0 g/dL   HCT 01/24/21 (L) 38.1 - 82.9 %   MCV 84.4 80.0 - 100.0 fL   MCH 27.9 26.0 - 34.0 pg   MCHC 33.0 30.0 - 36.0 g/dL   RDW 93.7 16.9 - 67.8 %   Platelets 226 150 - 400 K/uL   nRBC 0.0 0.0 - 0.2 %  Comprehensive metabolic panel     Status: Abnormal   Collection Time: 01/22/21 11:41 PM  Result Value Ref Range   Sodium 134 (L) 135 - 145 mmol/L   Potassium 3.9 3.5 - 5.1 mmol/L   Chloride 103 98 - 111 mmol/L   CO2 22 22 - 32 mmol/L   Glucose, Bld 90 70 - 99 mg/dL   BUN <5 (L) 6 - 20 mg/dL   Creatinine, Ser 01/24/21 0.44 - 1.00 mg/dL   Calcium 9.3 8.9 - 1.01 mg/dL   Total Protein 7.2 6.5 - 8.1 g/dL   Albumin 3.1 (L) 3.5 - 5.0 g/dL   AST 17 15 - 41 U/L   ALT 13 0 - 44 U/L   Alkaline Phosphatase 59 38 - 126 U/L   Total Bilirubin 0.6 0.3 - 1.2 mg/dL   GFR, Estimated 75.1 >02 mL/min   Anion gap 9 5 - 15   AB/Positive/-- (08/24 1510)  Imaging:  No results found.  MAU Course/MDM: I have ordered labs and reviewed results. No leukocytosis, no  hypokalemia. .  Treatments in MAU included IV Hydration, Promethazine. She got good relief from this and was able to keep down fluids.  Felt subjectively better also. .    Assessment: Single IUP  at [redacted]w[redacted]d Nausea and vomiting, probable gastroenteritis  Plan: Discharge home Has Phenergan at home  Advance diet as tolerated. Follow up in Office for prenatal visits Encouraged to return if she develops worsening of symptoms, increase in pain, fever, or other concerning symptoms.   Pt stable at time of discharge.  Wynelle Bourgeois CNM, MSN Certified Nurse-Midwife 01/22/2021 10:59 PM

## 2021-01-22 NOTE — MAU Note (Signed)
Having a lot of fatigue today. About lunch time I had a lot of nausea and vomiting. Slept the rest of the day. Ate something tonight and then vomited. Also can't keep down liquids. Had n/v early in preg but had stopped for awhile. Denies VB or LOF. No diarrhea

## 2021-01-23 DIAGNOSIS — O212 Late vomiting of pregnancy: Secondary | ICD-10-CM | POA: Diagnosis not present

## 2021-01-23 LAB — COMPREHENSIVE METABOLIC PANEL
ALT: 13 U/L (ref 0–44)
AST: 17 U/L (ref 15–41)
Albumin: 3.1 g/dL — ABNORMAL LOW (ref 3.5–5.0)
Alkaline Phosphatase: 59 U/L (ref 38–126)
Anion gap: 9 (ref 5–15)
BUN: <5 mg/dL — ABNORMAL LOW (ref 6–20)
CO2: 22 mmol/L (ref 22–32)
Calcium: 9.3 mg/dL (ref 8.9–10.3)
Chloride: 103 mmol/L (ref 98–111)
Creatinine, Ser: 0.54 mg/dL (ref 0.44–1.00)
GFR, Estimated: >60 mL/min (ref >60)
Glucose, Bld: 90 mg/dL (ref 70–99)
Potassium: 3.9 mmol/L (ref 3.5–5.1)
Sodium: 134 mmol/L — ABNORMAL LOW (ref 135–145)
Total Bilirubin: 0.6 mg/dL (ref 0.3–1.2)
Total Protein: 7.2 g/dL (ref 6.5–8.1)

## 2021-01-23 NOTE — Progress Notes (Signed)
Written and verbal d/c instructions given and understanding voiced. 

## 2021-01-24 ENCOUNTER — Telehealth: Payer: Self-pay | Admitting: Obstetrics & Gynecology

## 2021-01-24 DIAGNOSIS — R112 Nausea with vomiting, unspecified: Secondary | ICD-10-CM

## 2021-01-24 MED ORDER — DOXYLAMINE-PYRIDOXINE 10-10 MG PO TBEC: 2.0000 | DELAYED_RELEASE_TABLET | Freq: Every evening | ORAL | 4 refills | Status: DC

## 2021-01-24 NOTE — Telephone Encounter (Signed)
Patient got sick and couldn't keep anything down on the 26th and went to hospital. They told her that it was a virus and to keep taking her nausea medication. She says she's still not able to keep anything down as of today.

## 2021-01-24 NOTE — Telephone Encounter (Signed)
Returned pt's call, two identifiers used. Pt stated that she is still having difficulty keeping anything down except some Gatorolyte. She left work early today d/t the nausea. She took her prescribed Phenergan last night and it seems to work well, but makes her too sleepy to take when going to work. She states that her urine is dark yellow and her mouth feels dry. She states that there is good baby movement and she denies any contractions or bleeding/spotting. Discussed with Dr Charlotta Newton who will be sending in a prescription for her to try. Pt instructed to try ginger products and seek emergent care if she did not improve or it worsened over the weekend. Pt confirmed understanding.

## 2021-02-17 ENCOUNTER — Encounter: Payer: Self-pay | Admitting: Obstetrics & Gynecology

## 2021-02-17 ENCOUNTER — Ambulatory Visit (INDEPENDENT_AMBULATORY_CARE_PROVIDER_SITE_OTHER): Payer: Managed Care, Other (non HMO) | Admitting: Obstetrics & Gynecology

## 2021-02-17 ENCOUNTER — Other Ambulatory Visit: Payer: Self-pay

## 2021-02-17 ENCOUNTER — Ambulatory Visit (INDEPENDENT_AMBULATORY_CARE_PROVIDER_SITE_OTHER): Payer: Managed Care, Other (non HMO)

## 2021-02-17 VITALS — BP 136/82 | HR 112 | Wt 181.0 lb

## 2021-02-17 DIAGNOSIS — Z3A25 25 weeks gestation of pregnancy: Secondary | ICD-10-CM

## 2021-02-17 DIAGNOSIS — Z23 Encounter for immunization: Secondary | ICD-10-CM

## 2021-02-17 DIAGNOSIS — O10919 Unspecified pre-existing hypertension complicating pregnancy, unspecified trimester: Secondary | ICD-10-CM

## 2021-02-17 DIAGNOSIS — O0990 Supervision of high risk pregnancy, unspecified, unspecified trimester: Secondary | ICD-10-CM

## 2021-02-17 LAB — POCT URINALYSIS DIPSTICK OB
Blood, UA: NEGATIVE
Glucose, UA: NEGATIVE
Ketones, UA: NEGATIVE
Nitrite, UA: NEGATIVE
POC,PROTEIN,UA: NEGATIVE

## 2021-02-17 NOTE — Progress Notes (Signed)
[place] 25+3 wks,cephalic,cx 3.1 cm,posterior placenta gr 2,svp of fluid 5.1 cm,fhr 159 bpm,EFW 882 g 65%

## 2021-02-17 NOTE — Progress Notes (Signed)
HIGH-RISK PREGNANCY VISIT Patient name: Bianca Mclaughlin MRN [id number]  Date of birth: 02/17/2001 Chief Complaint:   High Risk Gestation ([place] today)  History of Present Illness:   Bianca Mclaughlin is a 20 y.o. G1P0000 female at [redacted]w[redacted]d with an Estimated Date of Delivery: 05/30/21 being seen today for ongoing management of a high-risk pregnancy complicated by chronic hypertension currently on no meds.    Today she reports no complaints. Contractions: Not present. Vag. Bleeding: None.  Movement: Present. denies leaking of fluid.   Depression screen [facility] 2/9 11/20/2020 10/01/2020 09/12/2020 05/10/2020 02/07/2020  Decreased Interest 0 0 0 0 0  Down, Depressed, Hopeless 0 0 0 0 0  PHQ - 2 Score 0 0 0 0 0  Altered sleeping 1 1 - - -  Tired, decreased energy 1 1 - - -  Change in appetite 0 1 - - -  Feeling bad or failure about yourself  0 0 - - -  Trouble concentrating 1 0 - - -  Moving slowly or fidgety/restless 0 0 - - -  Suicidal thoughts 0 0 - - -  PHQ-9 Score 3 3 - - -  Difficult doing work/chores - - - - -     GAD 7 : Generalized Anxiety Score 11/20/2020 10/01/2020  Nervous, Anxious, on Edge 0 0  Control/stop worrying 0 0  Worry too much - different things 1 0  Trouble relaxing 0 0  Restless 0 0  Easily annoyed or irritable 1 1  Afraid - awful might happen 0 0  Total GAD 7 Score 2 1     Review of Systems:   Pertinent items are noted in HPI Denies abnormal vaginal discharge w/ itching/odor/irritation, headaches, visual changes, shortness of breath, chest pain, abdominal pain, severe nausea/vomiting, or problems with urination or bowel movements unless otherwise stated above. Pertinent History Reviewed:  Reviewed past medical,surgical, social, obstetrical and family history.  Reviewed problem list, medications and allergies. Physical Assessment:   Vitals:   02/17/21 1546  BP: 136/82  Pulse: (!) 112  Weight: 181 lb (82.1 kg)  Body mass index is 32.06 kg/m.           Physical  Examination:   General appearance: alert, well appearing, and in no distress  Mental status: alert, oriented to person, place, and time  Skin: warm & dry   Extremities: Edema: Trace    Cardiovascular: normal heart rate noted  Respiratory: normal respiratory effort, no distress  Abdomen: gravid, soft, non-tender  Pelvic: Cervical exam deferred         Fetal Status:     Movement: Present    Fetal Surveillance Testing today: sonogram   Chaperone: N/A    Results for orders placed or performed in visit on 02/17/21 (from the past 24 hour(s))  POC Urinalysis Dipstick OB   Collection Time: 02/17/21  3:49 PM  Result Value Ref Range   Color, UA     Clarity, UA     Glucose, UA Negative Negative   Bilirubin, UA     Ketones, UA neg    Spec Grav, UA     Blood, UA neg    pH, UA     POC,PROTEIN,UA Negative Negative, Trace, Small (1+), Moderate (2+), Large (3+), 4+   Urobilinogen, UA     Nitrite, UA neg    Leukocytes, UA Moderate (2+) (A) Negative   Appearance     Odor      Assessment & Plan:  High-risk pregnancy: G1P0000 at [redacted]w[redacted]d  with an Estimated Date of Delivery: 05/30/21   1) CHTN, no meds, will keep BID log of BP at home  2) +SMA carrier,   Meds: No orders of the defined types were placed in this encounter.   Labs/procedures today: U/S  Treatment Plan:  no meds needed at this point  Reviewed: Preterm labor symptoms and general obstetric precautions including but not limited to vaginal bleeding, contractions, leaking of fluid and fetal movement were reviewed in detail with the patient.  All questions were answered. Does have home bp cuff. Office bp cuff given: not applicable. Check bp twice daily, let us know if consistently >150 and/or >95.  Follow-up: Return in about 3 weeks (around 03/10/2021) for PN2, HROB.   Future Appointments  Date Time Provider Department Center  03/14/2021 11:00 AM Anabel Halon, MD [facility]    Orders Placed This Encounter  Procedures   Flu  Vaccine QUAD 46mo+IM (Fluarix, Fluzone & Alfiuria Quad PF)   POC Urinalysis Dipstick OB   Lazaro Arms  02/17/2021 4:01 PM

## 2021-03-12 ENCOUNTER — Other Ambulatory Visit: Payer: Managed Care, Other (non HMO)

## 2021-03-12 ENCOUNTER — Ambulatory Visit (INDEPENDENT_AMBULATORY_CARE_PROVIDER_SITE_OTHER): Payer: Managed Care, Other (non HMO) | Admitting: Obstetrics & Gynecology

## 2021-03-12 ENCOUNTER — Encounter: Payer: Self-pay | Admitting: Obstetrics & Gynecology

## 2021-03-12 ENCOUNTER — Other Ambulatory Visit: Payer: Self-pay

## 2021-03-12 VITALS — BP 127/84 | HR 106 | Wt 183.0 lb

## 2021-03-12 DIAGNOSIS — Z3A28 28 weeks gestation of pregnancy: Secondary | ICD-10-CM

## 2021-03-12 DIAGNOSIS — O10919 Unspecified pre-existing hypertension complicating pregnancy, unspecified trimester: Secondary | ICD-10-CM

## 2021-03-12 DIAGNOSIS — O0990 Supervision of high risk pregnancy, unspecified, unspecified trimester: Secondary | ICD-10-CM

## 2021-03-12 NOTE — Progress Notes (Signed)
HIGH-RISK PREGNANCY VISIT Patient name: Bianca Mclaughlin MRN [id number]  Date of birth: 12-Jan-2001 Chief Complaint:   High Risk Gestation (PN2 today)  History of Present Illness:   Bianca Mclaughlin is a 20 y.o. G1P0000 female at [redacted]w[redacted]d with an Estimated Date of Delivery: 05/30/21 being seen today for ongoing management of a high-risk pregnancy complicated by chronic hypertension- no meds.    Today she reports no complaints.   Contractions: Not present. Vag. Bleeding: None.  Movement: Present. denies leaking of fluid.   Depression screen [facility] 2/9 03/12/2021 11/20/2020 10/01/2020 09/12/2020 05/10/2020  Decreased Interest 0 0 0 0 0  Down, Depressed, Hopeless 0 0 0 0 0  PHQ - 2 Score 0 0 0 0 0  Altered sleeping 0 1 1 - -  Tired, decreased energy 0 1 1 - -  Change in appetite 0 0 1 - -  Feeling bad or failure about yourself  0 0 0 - -  Trouble concentrating 0 1 0 - -  Moving slowly or fidgety/restless 0 0 0 - -  Suicidal thoughts 0 0 0 - -  PHQ-9 Score 0 3 3 - -  Difficult doing work/chores - - - - -     Current Outpatient Medications  Medication Instructions   aspirin 162 mg, Oral, Daily   Prenatal Vit-Fe Fumarate-FA (PRENATAL VITAMIN PO) Oral   promethazine (PHENERGAN) 25 mg, Oral, Every 6 hours PRN   Pyridoxine HCl (VITAMIN B-6 PO) Oral   triamcinolone ointment (KENALOG) 0.5 % 1 application, Topical, 2 times daily     Review of Systems:   Pertinent items are noted in HPI Denies abnormal vaginal discharge w/ itching/odor/irritation, headaches, visual changes, shortness of breath, chest pain, abdominal pain, severe nausea/vomiting, or problems with urination or bowel movements unless otherwise stated above. Pertinent History Reviewed:  Reviewed past medical,surgical, social, obstetrical and family history.  Reviewed problem list, medications and allergies. Physical Assessment:   Vitals:   03/12/21 0925  BP: 127/84  Pulse: (!) 106  Weight: 183 lb (83 kg)  Body mass index is 32.42  kg/m.           Physical Examination:   General appearance: alert, well appearing, and in no distress  Mental status: normal mood, behavior, speech, dress, motor activity, and thought processes  Skin: warm & dry   Extremities: Edema: Trace    Cardiovascular: normal heart rate noted  Respiratory: normal respiratory effort, no distress  Abdomen: gravid, soft, non-tender  Pelvic: Cervical exam deferred         Fetal Status: Fetal Heart Rate (bpm): 150 Fundal Height: 28 cm Movement: Present    Fetal Surveillance Testing today: doppler   Chaperone: N/A    No results found for this or any previous visit (from the past 24 hour(s)).   Assessment & Plan:  High-risk pregnancy: G1P0000 at [redacted]w[redacted]d with an Estimated Date of Delivery: 05/30/21   1) Chronic HTN -no medication -growth q 4wks- last completed 11/21  Meds: No orders of the defined types were placed in this encounter.   Labs/procedures today: PN-2  Treatment Plan:  continue with OB care as outlined above  Reviewed: Preterm labor symptoms and general obstetric precautions including but not limited to vaginal bleeding, contractions, leaking of fluid and fetal movement were reviewed in detail with the patient.  All questions were answered. Pt has home bp cuff. Check bp weekly, let us know if >140/90.   Follow-up: Return in about 2 weeks (around 03/26/2021) for HROB visit-  if possible growth next visit, otherwise growth in 4wks with next appt.   Future Appointments  Date Time Provider [place]  03/14/2021 11:00 AM Lindell Spar, MD [facility]  04/09/2021  8:30 AM [facility] [place] [facility] None  04/09/2021  9:30 AM Myrtis Ser, CNM [facility]    No orders of the defined types were placed in this encounter.   Janyth Pupa, DO Attending [place], [facility] for [employer], [place]

## 2021-03-13 LAB — GLUCOSE TOLERANCE, 2 HOURS W/ 1HR
Glucose, 1 hour: 101 mg/dL (ref 70–179)
Glucose, 2 hour: 123 mg/dL (ref 70–152)
Glucose, Fasting: 83 mg/dL (ref 70–91)

## 2021-03-13 LAB — CBC
Hematocrit: 34.2 % (ref 34.0–46.6)
Hemoglobin: 11.5 g/dL (ref 11.1–15.9)
MCH: 26.9 pg (ref 26.6–33.0)
MCHC: 33.6 g/dL (ref 31.5–35.7)
MCV: 80 fL (ref 79–97)
Platelets: 227 10*3/uL (ref 150–450)
RBC: 4.28 x10E6/uL (ref 3.77–5.28)
RDW: 12.7 % (ref 11.7–15.4)
WBC: 5 10*3/uL (ref 3.4–10.8)

## 2021-03-13 LAB — HIV ANTIBODY (ROUTINE TESTING W REFLEX): HIV Screen 4th Generation wRfx: NONREACTIVE

## 2021-03-13 LAB — RPR: RPR Ser Ql: NONREACTIVE

## 2021-03-13 LAB — ANTIBODY SCREEN: Antibody Screen: NEGATIVE

## 2021-03-14 ENCOUNTER — Encounter: Payer: Managed Care, Other (non HMO) | Admitting: Internal Medicine

## 2021-03-14 ENCOUNTER — Telehealth: Payer: Self-pay | Admitting: Internal Medicine

## 2021-03-14 NOTE — Telephone Encounter (Signed)
Pt returning call

## 2021-03-14 NOTE — Telephone Encounter (Signed)
Returned pt call  

## 2021-03-30 NOTE — L&D Delivery Note (Signed)
Delivery Note After one cytotec, pt progressed steadily through labor.  Shortly after receiving an epidural, she was noted to be C/C/+2.  After a 20 minute 2nd stage, at 11:34 PM a viable female was delivered via Vaginal, Spontaneous (Presentation: Left Occiput Anterior).  APGAR: 9, 9; weight pending.  After 1 minute, the cord was clamped and cut. 40 units of pitocin diluted in 1000cc LR was infused rapidly IV.  The placenta separated spontaneously and delivered via CCT and maternal pushing effort.  It was inspected and appears to be intact with a 3 VC.   Anesthesia: Epidural Episiotomy: None Lacerations: 1st degree;Perineal Suture Repair: 2.0 vicryl Est. Blood Loss (mL): 200  Mom to postpartum.  Baby to Couplet care / Skin to Skin.  Bianca Mclaughlin 05/09/2021, 12:26 AM

## 2021-04-08 ENCOUNTER — Other Ambulatory Visit: Payer: Self-pay | Admitting: Obstetrics & Gynecology

## 2021-04-08 DIAGNOSIS — O10919 Unspecified pre-existing hypertension complicating pregnancy, unspecified trimester: Secondary | ICD-10-CM

## 2021-04-09 ENCOUNTER — Ambulatory Visit (INDEPENDENT_AMBULATORY_CARE_PROVIDER_SITE_OTHER): Payer: Managed Care, Other (non HMO)

## 2021-04-09 ENCOUNTER — Other Ambulatory Visit: Payer: Self-pay

## 2021-04-09 ENCOUNTER — Ambulatory Visit (INDEPENDENT_AMBULATORY_CARE_PROVIDER_SITE_OTHER): Payer: Managed Care, Other (non HMO) | Admitting: Advanced Practice Midwife

## 2021-04-09 VITALS — BP 136/88 | HR 102 | Wt 184.8 lb

## 2021-04-09 DIAGNOSIS — I10 Essential (primary) hypertension: Secondary | ICD-10-CM

## 2021-04-09 DIAGNOSIS — Z3A32 32 weeks gestation of pregnancy: Secondary | ICD-10-CM

## 2021-04-09 DIAGNOSIS — O10919 Unspecified pre-existing hypertension complicating pregnancy, unspecified trimester: Secondary | ICD-10-CM

## 2021-04-09 DIAGNOSIS — O0990 Supervision of high risk pregnancy, unspecified, unspecified trimester: Secondary | ICD-10-CM

## 2021-04-09 NOTE — Progress Notes (Signed)
HIGH-RISK PREGNANCY VISIT Patient name: Bianca Mclaughlin MRN [id number]  Date of birth: [birth year] Chief Complaint:   Routine Prenatal Visit  History of Present Illness:   Bianca Mclaughlin is a 21 y.o. G63P0000 female at [redacted]w[redacted]d with an Estimated Date of Delivery: 05/30/21 being seen today for ongoing management of a high-risk pregnancy complicated by chronic hypertension currently on no meds.    Today she reports  occ BLE swelling . Contractions: Not present. Vag. Bleeding: None.  Movement: Present. denies leaking of fluid.   Depression screen [facility] 2/9 03/12/2021 11/20/2020 10/01/2020 09/12/2020 05/10/2020  Decreased Interest 0 0 0 0 0  Down, Depressed, Hopeless 0 0 0 0 0  PHQ - 2 Score 0 0 0 0 0  Altered sleeping 0 1 1 - -  Tired, decreased energy 0 1 1 - -  Change in appetite 0 0 1 - -  Feeling bad or failure about yourself  0 0 0 - -  Trouble concentrating 0 1 0 - -  Moving slowly or fidgety/restless 0 0 0 - -  Suicidal thoughts 0 0 0 - -  PHQ-9 Score 0 3 3 - -  Difficult doing work/chores - - - - -     GAD 7 : Generalized Anxiety Score 03/12/2021 11/20/2020 10/01/2020  Nervous, Anxious, on Edge 0 0 0  Control/stop worrying 0 0 0  Worry too much - different things 0 1 0  Trouble relaxing 0 0 0  Restless 0 0 0  Easily annoyed or irritable 0 1 1  Afraid - awful might happen 0 0 0  Total GAD 7 Score 0 2 1     Review of Systems:   Pertinent items are noted in HPI Denies abnormal vaginal discharge w/ itching/odor/irritation, headaches, visual changes, shortness of breath, chest pain, abdominal pain, severe nausea/vomiting, or problems with urination or bowel movements unless otherwise stated above. Pertinent History Reviewed:  Reviewed past medical,surgical, social, obstetrical and family history.  Reviewed problem list, medications and allergies. Physical Assessment:   Vitals:   04/09/21 0906  BP: 136/88  Pulse: (!) 102  Weight: 184 lb 12.8 oz (83.8 kg)  Body mass index is 32.74  kg/m.           Physical Examination:   General appearance: alert, well appearing, and in no distress  Mental status: alert, oriented to person, place, and time  Skin: warm & dry   Extremities: Edema: Trace    Cardiovascular: normal heart rate noted  Respiratory: normal respiratory effort, no distress  Abdomen: gravid, soft, non-tender  Pelvic: Cervical exam deferred         Fetal Status: Fetal Heart Rate (bpm): 164 u/s   Movement: Present    Fetal Surveillance Testing today: [place] [id number] wks,cephalic,posterior placenta gr 3,fhr 164 bpm,FHR 10.3 cm,EFW 2258 g 71%,limited head measurement because of fetal position     No results found for this or any previous visit (from the past 24 hour(s)).  Assessment & Plan:  High-risk pregnancy: G1P0000 at [redacted]w[redacted]d with an Estimated Date of Delivery: 05/30/21   1) cHTN, stable without meds; growth scan today   Meds: No orders of the defined types were placed in this encounter.   Labs/procedures today: U/S  Treatment Plan:  growth @ 36wks; IOL 39.6wks  Reviewed: Preterm labor symptoms and general obstetric precautions including but not limited to vaginal bleeding, contractions, leaking of fluid and fetal movement were reviewed in detail with the patient.  All questions were answered. Does have  home bp cuff. Office bp cuff given: not applicable. Check bp daily, let us know if consistently >140 and/or >90.  Follow-up: No follow-ups on file.   Future Appointments  Date Time Provider [place]  04/09/2021  9:30 AM Myrtis Ser, CNM [facility]    No orders of the defined types were placed in this encounter.  Myrtis Ser CNM 04/09/2021 9:20 AM

## 2021-04-09 NOTE — Progress Notes (Signed)
[place] 32+5 wks,cephalic,posterior placenta gr 3,fhr 164 bpm,FHR 10.3 cm,EFW 2258 g 71%,limited head measurement because of fetal position

## 2021-04-09 NOTE — Patient Instructions (Signed)
Bianca Mclaughlin, thank you for choosing our office today! We appreciate the opportunity to meet your healthcare needs. You may receive a short survey by mail, e-mail, or through MyChart. If you are happy with your care we would appreciate if you could take just a few minutes to complete the survey questions. We read all of your comments and take your feedback very seriously. Thank you again for choosing our office.  [facility] ([address] [place], [place] [address]) Entrance C, located off of [address] Free 24/7 valet parking   CLASSES: Go to [email or web address] to register for classes (childbirth, breastfeeding, waterbirth, infant CPR, daddy bootcamp, etc.)  Call the office ([phone]) or go to [facility] if: You begin to have strong, frequent contractions Your water breaks.  Sometimes it is a big gush of fluid, sometimes it is just a trickle that keeps getting your panties wet or running down your legs You have vaginal bleeding.  It is normal to have a small amount of spotting if your cervix was checked.  You don't feel your baby moving like normal.  If you don't, get you something to eat and drink and lay down and focus on feeling your baby move.   If your baby is still not moving like normal, you should call the office or go to [facility].  Call the office ([phone]) or go to [facility] for these signs of pre-eclampsia: Severe headache that does not go away with Tylenol Visual changes- seeing spots, double, blurred vision Pain under your right breast or upper abdomen that does not go away with Tums or heartburn medicine Nausea and/or vomiting Severe swelling in your hands, feet, and face   Tdap Vaccine It is recommended that you get the Tdap vaccine during the third trimester of EACH pregnancy to help protect your baby from getting pertussis (whooping cough) 27-36 weeks is the BEST time to do  this so that you can pass the protection on to your baby. During pregnancy is better than after pregnancy, but if you are unable to get it during pregnancy it will be offered at the hospital.  You can get this vaccine with us, at the health department, your family doctor, or some local pharmacies Everyone who will be around your baby should also be up-to-date on their vaccines before the baby comes. Adults (who are not pregnant) only need 1 dose of Tdap during adulthood.   [place] Pediatricians/Family Doctors [facility] ([facility]): [address], [phone]           [facility]: [address], [phone]                 [facility] ([facility]): [address], [phone] (call to ask if accepting patients) [facility]: [address], [place], [phone]    [place] Pediatricians/Family Doctors [facility] ([facility]): [address], [phone] [facility]: [address], [phone] [facility]: [address], [phone]  [place] [facility]  [facility] ([facility]): [phone] [facility]: [address], [phone]   [place] [facility]: [address], [phone]  [place] [facility]  [facility]: [address], [phone]  Home Blood Pressure Monitoring for Patients   Your provider has recommended that you check your   blood pressure (BP) at least once a week at home. If you do not have a blood pressure cuff at home, one will be provided for you. Contact your provider if you have not received your monitor within 1 week.   Helpful Tips for Accurate Home Blood Pressure Checks  Don't smoke, exercise, or drink caffeine 30 minutes before checking your BP Use the restroom before checking your BP (a full bladder can raise your  pressure) Relax in a comfortable upright chair Feet on the ground Left arm resting comfortably on a flat surface at the level of your heart Legs uncrossed Back supported Sit quietly and don't talk Place the cuff on your bare arm Adjust snuggly, so that only two fingertips can fit between your skin and the top of the cuff Check 2 readings separated by at least one minute Keep a log of your BP readings For a visual, please reference this diagram: http://ccnc.care/bpdiagram  Provider Name: [facility]     Phone: [phone]  Zone 1: ALL CLEAR  Continue to monitor your symptoms:  BP reading is less than 140 (top number) or less than 90 (bottom number)  No right upper stomach pain No headaches or seeing spots No feeling nauseated or throwing up No swelling in face and hands  Zone 2: CAUTION Call your doctor's office for any of the following:  BP reading is greater than 140 (top number) or greater than 90 (bottom number)  Stomach pain under your ribs in the middle or right side Headaches or seeing spots Feeling nauseated or throwing up Swelling in face and hands  Zone 3: EMERGENCY  Seek immediate medical care if you have any of the following:  BP reading is greater than160 (top number) or greater than 110 (bottom number) Severe headaches not improving with Tylenol Serious difficulty catching your breath Any worsening symptoms from Zone 2   Third Trimester of Pregnancy The third trimester is from week 29 through week 42, months 7 through 9. The third trimester is a time when the fetus is growing rapidly. At the end of the ninth month, the fetus is about 20 inches in length and weighs 6-10 pounds.  BODY CHANGES Your body goes through many changes during pregnancy. The changes vary from woman to woman.  Your weight will continue to increase. You can expect to gain 25-35 pounds (11-16 kg) by the end of the pregnancy. You may begin to get stretch marks on your hips, abdomen,  and breasts. You may urinate more often because the fetus is moving lower into your pelvis and pressing on your bladder. You may develop or continue to have heartburn as a result of your pregnancy. You may develop constipation because certain hormones are causing the muscles that push waste through your intestines to slow down. You may develop hemorrhoids or swollen, bulging veins (varicose veins). You may have pelvic pain because of the weight gain and pregnancy hormones relaxing your joints between the bones in your pelvis. Backaches may result from overexertion of the muscles supporting your posture. You may have changes in your hair. These can include thickening of your hair, rapid growth, and changes in texture. Some women also have hair loss during or after pregnancy, or hair that feels dry or thin. Your hair will most likely return to normal after your baby is born. Your breasts will continue to grow and be tender. A yellow discharge may leak from your breasts called colostrum. Your belly button may stick out. You may   feel short of breath because of your expanding uterus. You may notice the fetus "dropping," or moving lower in your abdomen. You may have a bloody mucus discharge. This usually occurs a few days to a week before labor begins. Your cervix becomes thin and soft (effaced) near your due date. WHAT TO EXPECT AT YOUR PRENATAL EXAMS  You will have prenatal exams every 2 weeks until week 36. Then, you will have weekly prenatal exams. During a routine prenatal visit: You will be weighed to make sure you and the fetus are growing normally. Your blood pressure is taken. Your abdomen will be measured to track your baby's growth. The fetal heartbeat will be listened to. Any test results from the previous visit will be discussed. You may have a cervical check near your due date to see if you have effaced. At around 36 weeks, your caregiver will check your cervix. At the same time, your  caregiver will also perform a test on the secretions of the vaginal tissue. This test is to determine if a type of bacteria, Group B streptococcus, is present. Your caregiver will explain this further. Your caregiver may ask you: What your birth plan is. How you are feeling. If you are feeling the baby move. If you have had any abnormal symptoms, such as leaking fluid, bleeding, severe headaches, or abdominal cramping. If you have any questions. Other tests or screenings that may be performed during your third trimester include: Blood tests that check for low iron levels (anemia). Fetal testing to check the health, activity level, and growth of the fetus. Testing is done if you have certain medical conditions or if there are problems during the pregnancy. FALSE LABOR You may feel small, irregular contractions that eventually go away. These are called Braxton Hicks contractions, or false labor. Contractions may last for hours, days, or even weeks before true labor sets in. If contractions come at regular intervals, intensify, or become painful, it is best to be seen by your caregiver.  SIGNS OF LABOR  Menstrual-like cramps. Contractions that are 5 minutes apart or less. Contractions that start on the top of the uterus and spread down to the lower abdomen and back. A sense of increased pelvic pressure or back pain. A watery or bloody mucus discharge that comes from the vagina. If you have any of these signs before the 37th week of pregnancy, call your caregiver right away. You need to go to the hospital to get checked immediately. HOME CARE INSTRUCTIONS  Avoid all smoking, herbs, alcohol, and unprescribed drugs. These chemicals affect the formation and growth of the baby. Follow your caregiver's instructions regarding medicine use. There are medicines that are either safe or unsafe to take during pregnancy. Exercise only as directed by your caregiver. Experiencing uterine cramps is a good sign to  stop exercising. Continue to eat regular, healthy meals. Wear a good support bra for breast tenderness. Do not use hot tubs, steam rooms, or saunas. Wear your seat belt at all times when driving. Avoid raw meat, uncooked cheese, cat litter boxes, and soil used by cats. These carry germs that can cause birth defects in the baby. Take your prenatal vitamins. Try taking a stool softener (if your caregiver approves) if you develop constipation. Eat more high-fiber foods, such as fresh vegetables or fruit and whole grains. Drink plenty of fluids to keep your urine clear or pale yellow. Take warm sitz baths to soothe any pain or discomfort caused by hemorrhoids. Use hemorrhoid cream if  your caregiver approves. If you develop varicose veins, wear support hose. Elevate your feet for 15 minutes, 3-4 times a day. Limit salt in your diet. Avoid heavy lifting, wear low heal shoes, and practice good posture. Rest a lot with your legs elevated if you have leg cramps or low back pain. Visit your dentist if you have not gone during your pregnancy. Use a soft toothbrush to brush your teeth and be gentle when you floss. A sexual relationship may be continued unless your caregiver directs you otherwise. Do not travel far distances unless it is absolutely necessary and only with the approval of your caregiver. Take prenatal classes to understand, practice, and ask questions about the labor and delivery. Make a trial run to the hospital. Pack your hospital bag. Prepare the baby's nursery. Continue to go to all your prenatal visits as directed by your caregiver. SEEK MEDICAL CARE IF: You are unsure if you are in labor or if your water has broken. You have dizziness. You have mild pelvic cramps, pelvic pressure, or nagging pain in your abdominal area. You have persistent nausea, vomiting, or diarrhea. You have a bad smelling vaginal discharge. You have pain with urination. SEEK IMMEDIATE MEDICAL CARE IF:  You  have a fever. You are leaking fluid from your vagina. You have spotting or bleeding from your vagina. You have severe abdominal cramping or pain. You have rapid weight loss or gain. You have shortness of breath with chest pain. You notice sudden or extreme swelling of your face, hands, ankles, feet, or legs. You have not felt your baby move in over an hour. You have severe headaches that do not go away with medicine. You have vision changes. Document Released: 03/10/2001 Document Revised: 03/21/2013 Document Reviewed: 05/17/2012 [facility] Patient Information 2015 [place], [place]. This information is not intended to replace advice given to you by your health care provider. Make sure you discuss any questions you have with your health care provider.

## 2021-04-14 DIAGNOSIS — Z029 Encounter for administrative examinations, unspecified: Secondary | ICD-10-CM

## 2021-04-21 ENCOUNTER — Encounter: Payer: Self-pay | Admitting: *Deleted

## 2021-04-23 ENCOUNTER — Telehealth (INDEPENDENT_AMBULATORY_CARE_PROVIDER_SITE_OTHER): Payer: Managed Care, Other (non HMO) | Admitting: Advanced Practice Midwife

## 2021-04-23 ENCOUNTER — Encounter: Payer: Self-pay | Admitting: Advanced Practice Midwife

## 2021-04-23 DIAGNOSIS — Z3A34 34 weeks gestation of pregnancy: Secondary | ICD-10-CM

## 2021-04-23 DIAGNOSIS — O0990 Supervision of high risk pregnancy, unspecified, unspecified trimester: Secondary | ICD-10-CM

## 2021-04-23 DIAGNOSIS — O98513 Other viral diseases complicating pregnancy, third trimester: Secondary | ICD-10-CM

## 2021-04-23 DIAGNOSIS — O163 Unspecified maternal hypertension, third trimester: Secondary | ICD-10-CM

## 2021-04-23 DIAGNOSIS — U071 COVID-19: Secondary | ICD-10-CM

## 2021-04-23 NOTE — Patient Instructions (Signed)
Bianca Mclaughlin, thank you for choosing our office today! We appreciate the opportunity to meet your healthcare needs. You may receive a short survey by mail, e-mail, or through MyChart. If you are happy with your care we would appreciate if you could take just a few minutes to complete the survey questions. We read all of your comments and take your feedback very seriously. Thank you again for choosing our office.  [facility] ([address] [place], [place] [address]) Entrance C, located off of [address] Free 24/7 valet parking   CLASSES: Go to [email or web address] to register for classes (childbirth, breastfeeding, waterbirth, infant CPR, daddy bootcamp, etc.)  Call the office ([phone]) or go to [facility] if: You begin to have strong, frequent contractions Your water breaks.  Sometimes it is a big gush of fluid, sometimes it is just a trickle that keeps getting your panties wet or running down your legs You have vaginal bleeding.  It is normal to have a small amount of spotting if your cervix was checked.  You don't feel your baby moving like normal.  If you don't, get you something to eat and drink and lay down and focus on feeling your baby move.   If your baby is still not moving like normal, you should call the office or go to [facility].  Call the office ([phone]) or go to [facility] for these signs of pre-eclampsia: Severe headache that does not go away with Tylenol Visual changes- seeing spots, double, blurred vision Pain under your right breast or upper abdomen that does not go away with Tums or heartburn medicine Nausea and/or vomiting Severe swelling in your hands, feet, and face   Tdap Vaccine It is recommended that you get the Tdap vaccine during the third trimester of EACH pregnancy to help protect your baby from getting pertussis (whooping cough) 27-36 weeks is the BEST time to do  this so that you can pass the protection on to your baby. During pregnancy is better than after pregnancy, but if you are unable to get it during pregnancy it will be offered at the hospital.  You can get this vaccine with us, at the health department, your family doctor, or some local pharmacies Everyone who will be around your baby should also be up-to-date on their vaccines before the baby comes. Adults (who are not pregnant) only need 1 dose of Tdap during adulthood.   [place] Pediatricians/Family Doctors [facility] ([facility]): [address], [phone]           [facility]: [address], [phone]                [facility] ([facility]): [address], [phone] (call to ask if accepting patients) [facility]: [address], [place], [phone]    [place] Pediatricians/Family Doctors [facility] ([facility]): [address], [phone] [facility]: [address], [phone] [facility]: [address], [phone]  [place] [facility]  [facility] ([facility]): [phone] [facility]: [address], [phone]   [place] [facility]: [address], [phone]  [place] [facility]  [facility]: [address], [phone]  Home Blood Pressure Monitoring for Patients   Your provider has recommended that you check your   blood pressure (BP) at least once a week at home. If you do not have a blood pressure cuff at home, one will be provided for you. Contact your provider if you have not received your monitor within 1 week.   Helpful Tips for Accurate Home Blood Pressure Checks  Don't smoke, exercise, or drink caffeine 30 minutes before checking your BP Use the restroom before checking your BP (a full bladder can raise your  pressure) Relax in a comfortable upright chair Feet on the ground Left arm resting comfortably on a flat surface at the level of your heart Legs uncrossed Back supported Sit quietly and don't talk Place the cuff on your bare arm Adjust snuggly, so that only two fingertips can fit between your skin and the top of the cuff Check 2 readings separated by at least one minute Keep a log of your BP readings For a visual, please reference this diagram: http://ccnc.care/bpdiagram  Provider Name: [facility]     Phone: [phone]  Zone 1: ALL CLEAR  Continue to monitor your symptoms:  BP reading is less than 140 (top number) or less than 90 (bottom number)  No right upper stomach pain No headaches or seeing spots No feeling nauseated or throwing up No swelling in face and hands  Zone 2: CAUTION Call your doctor's office for any of the following:  BP reading is greater than 140 (top number) or greater than 90 (bottom number)  Stomach pain under your ribs in the middle or right side Headaches or seeing spots Feeling nauseated or throwing up Swelling in face and hands  Zone 3: EMERGENCY  Seek immediate medical care if you have any of the following:  BP reading is greater than160 (top number) or greater than 110 (bottom number) Severe headaches not improving with Tylenol Serious difficulty catching your breath Any worsening symptoms from Zone 2   Third Trimester of Pregnancy The third trimester is from week 29 through week 42, months 7 through 9. The third trimester is a time when the fetus is growing rapidly. At the end of the ninth month, the fetus is about 20 inches in length and weighs 6-10 pounds.  BODY CHANGES Your body goes through many changes during pregnancy. The changes vary from woman to woman.  Your weight will continue to increase. You can expect to gain 25-35 pounds (11-16 kg) by the end of the pregnancy. You may begin to get stretch marks on your hips, abdomen,  and breasts. You may urinate more often because the fetus is moving lower into your pelvis and pressing on your bladder. You may develop or continue to have heartburn as a result of your pregnancy. You may develop constipation because certain hormones are causing the muscles that push waste through your intestines to slow down. You may develop hemorrhoids or swollen, bulging veins (varicose veins). You may have pelvic pain because of the weight gain and pregnancy hormones relaxing your joints between the bones in your pelvis. Backaches may result from overexertion of the muscles supporting your posture. You may have changes in your hair. These can include thickening of your hair, rapid growth, and changes in texture. Some women also have hair loss during or after pregnancy, or hair that feels dry or thin. Your hair will most likely return to normal after your baby is born. Your breasts will continue to grow and be tender. A yellow discharge may leak from your breasts called colostrum. Your belly button may stick out. You may   feel short of breath because of your expanding uterus. You may notice the fetus "dropping," or moving lower in your abdomen. You may have a bloody mucus discharge. This usually occurs a few days to a week before labor begins. Your cervix becomes thin and soft (effaced) near your due date. WHAT TO EXPECT AT YOUR PRENATAL EXAMS  You will have prenatal exams every 2 weeks until week 36. Then, you will have weekly prenatal exams. During a routine prenatal visit: You will be weighed to make sure you and the fetus are growing normally. Your blood pressure is taken. Your abdomen will be measured to track your baby's growth. The fetal heartbeat will be listened to. Any test results from the previous visit will be discussed. You may have a cervical check near your due date to see if you have effaced. At around 36 weeks, your caregiver will check your cervix. At the same time, your  caregiver will also perform a test on the secretions of the vaginal tissue. This test is to determine if a type of bacteria, Group B streptococcus, is present. Your caregiver will explain this further. Your caregiver may ask you: What your birth plan is. How you are feeling. If you are feeling the baby move. If you have had any abnormal symptoms, such as leaking fluid, bleeding, severe headaches, or abdominal cramping. If you have any questions. Other tests or screenings that may be performed during your third trimester include: Blood tests that check for low iron levels (anemia). Fetal testing to check the health, activity level, and growth of the fetus. Testing is done if you have certain medical conditions or if there are problems during the pregnancy. FALSE LABOR You may feel small, irregular contractions that eventually go away. These are called Braxton Hicks contractions, or false labor. Contractions may last for hours, days, or even weeks before true labor sets in. If contractions come at regular intervals, intensify, or become painful, it is best to be seen by your caregiver.  SIGNS OF LABOR  Menstrual-like cramps. Contractions that are 5 minutes apart or less. Contractions that start on the top of the uterus and spread down to the lower abdomen and back. A sense of increased pelvic pressure or back pain. A watery or bloody mucus discharge that comes from the vagina. If you have any of these signs before the 37th week of pregnancy, call your caregiver right away. You need to go to the hospital to get checked immediately. HOME CARE INSTRUCTIONS  Avoid all smoking, herbs, alcohol, and unprescribed drugs. These chemicals affect the formation and growth of the baby. Follow your caregiver's instructions regarding medicine use. There are medicines that are either safe or unsafe to take during pregnancy. Exercise only as directed by your caregiver. Experiencing uterine cramps is a good sign to  stop exercising. Continue to eat regular, healthy meals. Wear a good support bra for breast tenderness. Do not use hot tubs, steam rooms, or saunas. Wear your seat belt at all times when driving. Avoid raw meat, uncooked cheese, cat litter boxes, and soil used by cats. These carry germs that can cause birth defects in the baby. Take your prenatal vitamins. Try taking a stool softener (if your caregiver approves) if you develop constipation. Eat more high-fiber foods, such as fresh vegetables or fruit and whole grains. Drink plenty of fluids to keep your urine clear or pale yellow. Take warm sitz baths to soothe any pain or discomfort caused by hemorrhoids. Use hemorrhoid cream if  your caregiver approves. If you develop varicose veins, wear support hose. Elevate your feet for 15 minutes, 3-4 times a day. Limit salt in your diet. Avoid heavy lifting, wear low heal shoes, and practice good posture. Rest a lot with your legs elevated if you have leg cramps or low back pain. Visit your dentist if you have not gone during your pregnancy. Use a soft toothbrush to brush your teeth and be gentle when you floss. A sexual relationship may be continued unless your caregiver directs you otherwise. Do not travel far distances unless it is absolutely necessary and only with the approval of your caregiver. Take prenatal classes to understand, practice, and ask questions about the labor and delivery. Make a trial run to the hospital. Pack your hospital bag. Prepare the baby's nursery. Continue to go to all your prenatal visits as directed by your caregiver. SEEK MEDICAL CARE IF: You are unsure if you are in labor or if your water has broken. You have dizziness. You have mild pelvic cramps, pelvic pressure, or nagging pain in your abdominal area. You have persistent nausea, vomiting, or diarrhea. You have a bad smelling vaginal discharge. You have pain with urination. SEEK IMMEDIATE MEDICAL CARE IF:  You  have a fever. You are leaking fluid from your vagina. You have spotting or bleeding from your vagina. You have severe abdominal cramping or pain. You have rapid weight loss or gain. You have shortness of breath with chest pain. You notice sudden or extreme swelling of your face, hands, ankles, feet, or legs. You have not felt your baby move in over an hour. You have severe headaches that do not go away with medicine. You have vision changes. Document Released: 03/10/2001 Document Revised: 03/21/2013 Document Reviewed: 05/17/2012 [facility] Patient Information 2015 [place], [place]. This information is not intended to replace advice given to you by your health care provider. Make sure you discuss any questions you have with your health care provider.

## 2021-04-23 NOTE — Progress Notes (Signed)
TELEHEALTH VIRTUAL OBSTETRICS VISIT ENCOUNTER NOTE Patient name: Bianca Mclaughlin MRN [id number]  Date of birth: 2001-03-29  I connected with patient on 04/23/21 at  1:50 PM EST by MyChart and verified that I am speaking with the correct person using two identifiers. Due to COVID-19 recommendations, pt is not currently in our office due to +Covid test today; provider is in the office.   I discussed the limitations, risks, security and privacy concerns of performing an evaluation and management service by telephone and the availability of in person appointments. I also discussed with the patient that there may be a patient responsible charge related to this service. The patient expressed understanding and agreed to proceed.  Chief Complaint:   Routine Prenatal Visit  History of Present Illness:   Bianca Mclaughlin is a 21 y.o. G1P0000 female at [redacted]w[redacted]d with an Estimated Date of Delivery: 05/30/21 being evaluated today for ongoing management of a high-risk pregnancy.  Depression screen [facility] 2/9 03/12/2021 11/20/2020 10/01/2020 09/12/2020 05/10/2020  Decreased Interest 0 0 0 0 0  Down, Depressed, Hopeless 0 0 0 0 0  PHQ - 2 Score 0 0 0 0 0  Altered sleeping 0 1 1 - -  Tired, decreased energy 0 1 1 - -  Change in appetite 0 0 1 - -  Feeling bad or failure about yourself  0 0 0 - -  Trouble concentrating 0 1 0 - -  Moving slowly or fidgety/restless 0 0 0 - -  Suicidal thoughts 0 0 0 - -  PHQ-9 Score 0 3 3 - -  Difficult doing work/chores - - - - -    Today she reports  a scratchy throat and cough; fever last night; +Covid test today; no preg concerns . Contractions: Not present. Vag. Bleeding: None.  Movement: Present. denies leaking of fluid. Review of Systems:   Pertinent items are noted in HPI Denies abnormal vaginal discharge w/ itching/odor/irritation, headaches, visual changes, shortness of breath, chest pain, abdominal pain, severe nausea/vomiting, or problems with urination or bowel movements  unless otherwise stated above. Pertinent History Reviewed:  Reviewed past medical,surgical, social, obstetrical and family history.  Reviewed problem list, medications and allergies. Physical Assessment:  There were no vitals filed for this visit.There is no height or weight on file to calculate BMI.        Physical Examination:   General:  Alert, oriented and cooperative.   Mental Status: Normal mood and affect perceived. Normal judgment and thought content.  Rest of physical exam deferred due to type of encounter  No results found for this or any previous visit (from the past 24 hour(s)).  Assessment & Plan:  1) Pregnancy G1P0000 at [redacted]w[redacted]d with an Estimated Date of Delivery: 05/30/21   2) +Covid today, supportive care tips given  3) cHTN, no meds; her BP cuff is acting up- she will send a message in later today with a value; she has been getting values recently all <130/80; growth ~ 36wks; IOL 38-39.6wks   Meds: No orders of the defined types were placed in this encounter.   Labs/procedures today: none  Plan:  Continue routine obstetrical care.  Has home bp cuff but it isn't working at the moment.  Check bp weekly, let us know if >140/90.  Next visit: prefers will be in person for growth scan, GBS/cultures     Reviewed: Preterm labor symptoms and general obstetric precautions including but not limited to vaginal bleeding, contractions, leaking of fluid and fetal movement were  reviewed in detail with the patient. The patient was advised to call back or seek an in-person office evaluation/go to [facility] at [facility] for any urgent or concerning symptoms. All questions were answered. Please refer to After Visit Summary for other counseling recommendations.    I provided 7 minutes of non-face-to-face time during this encounter.  Follow-up: Return in about 2 weeks (around 05/07/2021) for HROB, [place]: EFW (when you can fit it), GBS/cultures.  No orders of the defined types were  placed in this encounter.  Arabella Merles CNM 04/23/2021 2:08 PM

## 2021-05-05 ENCOUNTER — Ambulatory Visit (INDEPENDENT_AMBULATORY_CARE_PROVIDER_SITE_OTHER): Payer: Managed Care, Other (non HMO)

## 2021-05-05 ENCOUNTER — Encounter (HOSPITAL_COMMUNITY): Payer: Self-pay | Admitting: Family Medicine

## 2021-05-05 ENCOUNTER — Telehealth: Payer: Self-pay

## 2021-05-05 ENCOUNTER — Other Ambulatory Visit: Payer: Self-pay

## 2021-05-05 ENCOUNTER — Inpatient Hospital Stay (EMERGENCY_DEPARTMENT_HOSPITAL)
Admission: AD | Admit: 2021-05-05 | Discharge: 2021-05-05 | Disposition: A | Discharge status: Home / Self Care | Payer: Managed Care, Other (non HMO) | Source: Home / Self Care | Attending: Family Medicine | Admitting: Family Medicine

## 2021-05-05 ENCOUNTER — Other Ambulatory Visit: Payer: Self-pay | Admitting: Family Medicine

## 2021-05-05 VITALS — BP 154/98 | HR 109 | Wt 190.2 lb

## 2021-05-05 DIAGNOSIS — I10 Essential (primary) hypertension: Secondary | ICD-10-CM

## 2021-05-05 DIAGNOSIS — Z3A36 36 weeks gestation of pregnancy: Secondary | ICD-10-CM

## 2021-05-05 DIAGNOSIS — O10913 Unspecified pre-existing hypertension complicating pregnancy, third trimester: Secondary | ICD-10-CM | POA: Insufficient documentation

## 2021-05-05 DIAGNOSIS — O0993 Supervision of high risk pregnancy, unspecified, third trimester: Secondary | ICD-10-CM

## 2021-05-05 DIAGNOSIS — O26893 Other specified pregnancy related conditions, third trimester: Secondary | ICD-10-CM | POA: Insufficient documentation

## 2021-05-05 DIAGNOSIS — R102 Pelvic and perineal pain: Secondary | ICD-10-CM | POA: Insufficient documentation

## 2021-05-05 DIAGNOSIS — R1011 Right upper quadrant pain: Secondary | ICD-10-CM | POA: Insufficient documentation

## 2021-05-05 DIAGNOSIS — O1002 Pre-existing essential hypertension complicating childbirth: Secondary | ICD-10-CM | POA: Diagnosis not present

## 2021-05-05 DIAGNOSIS — M7989 Other specified soft tissue disorders: Secondary | ICD-10-CM | POA: Insufficient documentation

## 2021-05-05 DIAGNOSIS — M549 Dorsalgia, unspecified: Secondary | ICD-10-CM | POA: Insufficient documentation

## 2021-05-05 DIAGNOSIS — R350 Frequency of micturition: Secondary | ICD-10-CM | POA: Insufficient documentation

## 2021-05-05 DIAGNOSIS — O0990 Supervision of high risk pregnancy, unspecified, unspecified trimester: Secondary | ICD-10-CM

## 2021-05-05 DIAGNOSIS — O10919 Unspecified pre-existing hypertension complicating pregnancy, unspecified trimester: Secondary | ICD-10-CM | POA: Diagnosis not present

## 2021-05-05 HISTORY — DX: Essential (primary) hypertension: I10

## 2021-05-05 HISTORY — DX: Dermatitis, unspecified: L30.9

## 2021-05-05 LAB — CBC
HCT: 35.3 % — ABNORMAL LOW (ref 36.0–46.0)
Hemoglobin: 11.7 g/dL — ABNORMAL LOW (ref 12.0–15.0)
MCH: 27.2 pg (ref 26.0–34.0)
MCHC: 33.1 g/dL (ref 30.0–36.0)
MCV: 82.1 fL (ref 80.0–100.0)
Platelets: 208 10*3/uL (ref 150–400)
RBC: 4.3 MIL/uL (ref 3.87–5.11)
RDW: 16.5 % — ABNORMAL HIGH (ref 11.5–15.5)
WBC: 4.1 10*3/uL (ref 4.0–10.5)
nRBC: 0 % (ref 0.0–0.2)

## 2021-05-05 LAB — PROTEIN / CREATININE RATIO, URINE
Creatinine, Urine: 34.36 mg/dL
Total Protein, Urine: <6 mg/dL

## 2021-05-05 LAB — COMPREHENSIVE METABOLIC PANEL
ALT: 18 U/L (ref 0–44)
AST: 28 U/L (ref 15–41)
Albumin: 2.9 g/dL — ABNORMAL LOW (ref 3.5–5.0)
Alkaline Phosphatase: 106 U/L (ref 38–126)
Anion gap: 10 (ref 5–15)
BUN: <5 mg/dL — ABNORMAL LOW (ref 6–20)
CO2: 21 mmol/L — ABNORMAL LOW (ref 22–32)
Calcium: 9 mg/dL (ref 8.9–10.3)
Chloride: 105 mmol/L (ref 98–111)
Creatinine, Ser: 0.55 mg/dL (ref 0.44–1.00)
GFR, Estimated: >60 mL/min (ref >60)
Glucose, Bld: 86 mg/dL (ref 70–99)
Potassium: 3.7 mmol/L (ref 3.5–5.1)
Sodium: 136 mmol/L (ref 135–145)
Total Bilirubin: 0.7 mg/dL (ref 0.3–1.2)
Total Protein: 6.8 g/dL (ref 6.5–8.1)

## 2021-05-05 LAB — POCT URINALYSIS DIPSTICK OB
Glucose, UA: NEGATIVE
Ketones, UA: NEGATIVE
Nitrite, UA: NEGATIVE
POC,PROTEIN,UA: NEGATIVE

## 2021-05-05 NOTE — Telephone Encounter (Signed)
Pt called, two identifiers used. Pt stated that she began having intermittent cramping in her pelvis radiating to her back and at times in her upper right abdominal quadrant. She took her BP d/t having swelling in her hands and feet. She got a reading of 149/99. She took her BP again this morning @ 0715, 30 minutes after waking, and got a reading of 152/100. She denies headache today, but has had some that would go away on their own. She has some blurred vision and severe nausea and vomiting for the past 3 days. She can keep fluids down, but no solids. She can't take Phenergan d/t drowsiness. Discussed with Dr Charlotta Newton and it was recommended that she be seen, either in office or at MAU. Pt is coming to the office to be seen.

## 2021-05-05 NOTE — MAU Note (Signed)
Was feeling funny last night, having cramping in lower back and pelvis.  Took her BP and it was  145/100.  Took it again this morning, was 152/100.  Noted some swelling.  Went into dr, BP was 153/100.  Denies HA, having blurred vision, reports pain below right breast, +swelling in hands and feet. Cramping continues.

## 2021-05-05 NOTE — MAU Provider Note (Signed)
History     CSN: [id number]  Arrival date and time: 05/05/21 1001   Event Date/Time   First Provider Initiated Contact with Patient 05/05/21 1052      Chief Complaint  Patient presents with   Hypertension   Abdominal Pain   Pelvic Pain   Back Pain   Foot Swelling   Hypertension Associated symptoms include headaches. Pertinent negatives include no chest pain or shortness of breath.  Abdominal Pain Associated symptoms include frequency, headaches, nausea and vomiting. Pertinent negatives include no constipation, diarrhea, dysuria, fever or hematuria.  Pelvic Pain The patient's primary symptoms include pelvic pain and vaginal discharge. Associated symptoms include abdominal pain, back pain, frequency, headaches, nausea and vomiting. Pertinent negatives include no chills, constipation, diarrhea, dysuria, fever or hematuria.  Back Pain Associated symptoms include abdominal pain, headaches and pelvic pain. Pertinent negatives include no chest pain, dysuria or fever.   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 She has elevated blood pressure outside of pregnancy that has previously been controlled without medication. Her BP was 145/100 last night and 154/98 in the office this morning. She describes her vision changes as large blue spots in her visual field and blurriness. Her RUQ pain is constant and she has had intermittent lower abdominal cramping. Her RUQ pain radiates to her side. She has vomited 3 times in the past 24 hours. Prior to last night, she had not had nausea/vomiting since her first trimester. She has not been able to eat much due to the N/V but continues to drink fluids. She also had some dizziness last night. She has a mild frontal headache that comes and goes and feels similar to her typical sinus allergy  headaches.   She has also had increased urinary frequency with urinary cloudiness and yellow-white vaginal discharge.   OB History     Gravida  1   Para  0   Term  0   Preterm  0   AB  0   Living  0      SAB  0   IAB  0   Ectopic  0   Multiple  0   Live Births  0           Past Medical History:  Diagnosis Date   Acne vulgaris 02/20/2015   Asthma    Eczema    Family history of breast cancer in mother 04/15/2017   Start mammogram at 28.Marland KitchenShe is taking various herbal and vitamin products. Mom was 40   High cholesterol    Hypertension    Seasonal allergies    Urticaria     Past Surgical History:  Procedure Laterality Date   NO PAST SURGERIES      Family History  Problem Relation Age of Onset   Hypertension Mother    Breast cancer Mother 64   Allergic rhinitis Father    Eczema Father    Urticaria Father    Hypertension Father    Eczema Sister    Breast cancer Maternal Aunt 50   Hypertension Maternal Aunt    Breast cancer Paternal Aunt 41   Asthma Neg Hx    Immunodeficiency Neg Hx     Social History   Tobacco Use   Smoking status: Never   Smokeless tobacco: Never  Vaping Use   Vaping Use: Former  Substances: Nicotine, Flavoring  Substance Use Topics   Alcohol use: Not Currently    Comment: Occasionally   Drug use: No    Allergies:  Allergies  Allergen Reactions   Other Hives, Itching, Swelling and Rash    Cat dander   Macadamia Nut Oil Hives   Tree Extract Hives    Medications Prior to Admission  Medication Sig Dispense Refill Last Dose   aspirin 81 MG chewable tablet Chew 2 tablets (162 mg total) by mouth daily. 60 tablet 7 05/04/2021   Prenatal Vit-Fe Fumarate-FA (PRENATAL VITAMIN PO) Take by mouth.   05/04/2021   Pyridoxine HCl (VITAMIN B-6 PO) Take by mouth.   05/04/2021   promethazine (PHENERGAN) 25 MG tablet Take 1 tablet (25 mg total) by mouth every 6 (six) hours as needed for nausea or vomiting. (Patient not taking: Reported  on 04/09/2021) 30 tablet 1 More than a month   triamcinolone ointment (KENALOG) 0.5 % Apply 1 application topically 2 (two) times daily. 30 g 2 More than a month    Review of Systems  Constitutional:  Negative for chills and fever.  Eyes:  Positive for visual disturbance.  Respiratory:  Negative for cough and shortness of breath.   Cardiovascular:  Positive for leg swelling. Negative for chest pain.  Gastrointestinal:  Positive for abdominal pain, nausea and vomiting. Negative for constipation and diarrhea.  Genitourinary:  Positive for frequency, pelvic pain and vaginal discharge. Negative for dysuria, hematuria, vaginal bleeding and vaginal pain.  Musculoskeletal:  Positive for back pain.  Neurological:  Positive for dizziness and headaches.   Physical Exam   Blood pressure (!) 144/88, pulse (!) 106, temperature 98.4 F (36.9 C), temperature source Oral, resp. rate 18, height 5\' 4"  (1.626 m), weight 86 kg, last menstrual period 08/23/2020, SpO2 100 %.  Physical Exam Constitutional:      General: She is not in acute distress.    Appearance: She is not ill-appearing.  HENT:     Head: Normocephalic and atraumatic.  Cardiovascular:     Rate and Rhythm: Normal rate and regular rhythm.     Heart sounds: Normal heart sounds.  Pulmonary:     Effort: Pulmonary effort is normal.     Breath sounds: Normal breath sounds.  Abdominal:     Palpations: Abdomen is soft.     Tenderness: There is abdominal tenderness in the right upper quadrant. There is no guarding or rebound.     Comments: Gravid  Musculoskeletal:     Right lower leg: Edema present.     Left lower leg: Edema present.  Skin:    General: Skin is warm and dry.  Neurological:     General: No focal deficit present.     Mental Status: She is alert.     Deep Tendon Reflexes: Reflexes normal.     Comments: No clonus  Psychiatric:        Mood and Affect: Mood normal.        Behavior: Behavior normal.    MAU Course   Procedures  MDM BP on arrival was 150/94 and readings remained >140/80 in MAU. Pt did not require any medication for headache or nausea. CBC, CMP, and urine protein/creatinine ratio were normal. Differential includes worsening chronic HTN vs gestational hypertension. Her blood pressures today are similar to her first trimester readings. Discussed inducing at 37 weeks given probability of worsening BP with continued pregnancy. GBS culture collected today.  UA with small leukocytes and moderate blood. Urine culture pending.  Discharged home in stable condition with return precautions.  Assessment and Plan  Pregnancy at [redacted]w[redacted]d Worsening chronic hypertension vs gestational hypertension - Scheduled for IOL on 05/10/2021 - F/u GBS culture and urine culture - Return precautions discussed - BP >160/100, headache that does not resolve, or dark spots in vision should prompt return to MAU.  Reggy Eye 05/05/2021, 11:15 AM

## 2021-05-05 NOTE — Progress Notes (Signed)
° °  NURSE VISIT- BLOOD PRESSURE CHECK  SUBJECTIVE:  Bianca Mclaughlin is a 21 y.o. G64P0000 female here for BP check. She is 105w3d pregnant    HYPERTENSION ROS:  Pregnant:  Severe headaches that don't go away with tylenol/other medicines:  Intermittent that go away on their own Visual changes (seeing spots/double/blurred vision) Yes  Severe pain under right breast breast or in center of upper chest No  Severe nausea/vomiting Yes  Taking medicines as instructed yes  OBJECTIVE:  BP (!) 154/98    Pulse (!) 109    Wt 190 lb 3.2 oz (86.3 kg)    LMP 08/23/2020    BMI 33.69 kg/m   Appearance alert, well appearing, and in no distress and oriented to person, place, and time.  ASSESSMENT: Pregnancy [redacted]w[redacted]d  blood pressure check  PLAN: Discussed with Dr. Nelda Marseille   Recommendations:  Go to MAU now for further evaluation    Follow-up: as scheduled   Kalesha Irving A Broly Hatfield  05/05/2021 9:15 AM

## 2021-05-06 ENCOUNTER — Telehealth (HOSPITAL_COMMUNITY): Payer: Self-pay | Admitting: *Deleted

## 2021-05-06 ENCOUNTER — Encounter (HOSPITAL_COMMUNITY): Payer: Self-pay | Admitting: *Deleted

## 2021-05-06 ENCOUNTER — Other Ambulatory Visit: Payer: Self-pay | Admitting: Family Medicine

## 2021-05-06 LAB — CULTURE, BETA STREP (GROUP B ONLY)

## 2021-05-06 NOTE — Telephone Encounter (Signed)
Preadmission screen  

## 2021-05-07 ENCOUNTER — Encounter: Payer: Self-pay | Admitting: Obstetrics & Gynecology

## 2021-05-07 ENCOUNTER — Inpatient Hospital Stay (EMERGENCY_DEPARTMENT_HOSPITAL)
Admission: AD | Admit: 2021-05-07 | Discharge: 2021-05-07 | Disposition: A | Discharge status: Home / Self Care | Payer: Managed Care, Other (non HMO) | Source: Home / Self Care | Attending: Obstetrics and Gynecology | Admitting: Obstetrics and Gynecology

## 2021-05-07 ENCOUNTER — Ambulatory Visit (INDEPENDENT_AMBULATORY_CARE_PROVIDER_SITE_OTHER): Payer: Managed Care, Other (non HMO) | Admitting: Obstetrics & Gynecology

## 2021-05-07 ENCOUNTER — Encounter: Payer: Self-pay | Admitting: Student

## 2021-05-07 ENCOUNTER — Other Ambulatory Visit: Payer: Self-pay

## 2021-05-07 ENCOUNTER — Encounter (HOSPITAL_COMMUNITY): Payer: Self-pay | Admitting: Obstetrics and Gynecology

## 2021-05-07 VITALS — BP 133/89 | HR 102 | Wt 189.0 lb

## 2021-05-07 DIAGNOSIS — Z3A36 36 weeks gestation of pregnancy: Secondary | ICD-10-CM

## 2021-05-07 DIAGNOSIS — O10013 Pre-existing essential hypertension complicating pregnancy, third trimester: Secondary | ICD-10-CM | POA: Insufficient documentation

## 2021-05-07 DIAGNOSIS — Z7982 Long term (current) use of aspirin: Secondary | ICD-10-CM | POA: Insufficient documentation

## 2021-05-07 DIAGNOSIS — R519 Headache, unspecified: Secondary | ICD-10-CM

## 2021-05-07 DIAGNOSIS — O10919 Unspecified pre-existing hypertension complicating pregnancy, unspecified trimester: Secondary | ICD-10-CM | POA: Diagnosis not present

## 2021-05-07 DIAGNOSIS — B951 Streptococcus, group B, as the cause of diseases classified elsewhere: Secondary | ICD-10-CM | POA: Insufficient documentation

## 2021-05-07 DIAGNOSIS — O0990 Supervision of high risk pregnancy, unspecified, unspecified trimester: Secondary | ICD-10-CM

## 2021-05-07 DIAGNOSIS — I10 Essential (primary) hypertension: Secondary | ICD-10-CM

## 2021-05-07 DIAGNOSIS — O26893 Other specified pregnancy related conditions, third trimester: Secondary | ICD-10-CM | POA: Diagnosis not present

## 2021-05-07 LAB — CBC
HCT: 33.9 % — ABNORMAL LOW (ref 36.0–46.0)
Hemoglobin: 11.3 g/dL — ABNORMAL LOW (ref 12.0–15.0)
MCH: 27.2 pg (ref 26.0–34.0)
MCHC: 33.3 g/dL (ref 30.0–36.0)
MCV: 81.7 fL (ref 80.0–100.0)
Platelets: 205 10*3/uL (ref 150–400)
RBC: 4.15 MIL/uL (ref 3.87–5.11)
RDW: 16.3 % — ABNORMAL HIGH (ref 11.5–15.5)
WBC: 5.5 10*3/uL (ref 4.0–10.5)
nRBC: 0 % (ref 0.0–0.2)

## 2021-05-07 LAB — COMPREHENSIVE METABOLIC PANEL
ALT: 17 U/L (ref 0–44)
AST: 28 U/L (ref 15–41)
Albumin: 2.9 g/dL — ABNORMAL LOW (ref 3.5–5.0)
Alkaline Phosphatase: 100 U/L (ref 38–126)
Anion gap: 10 (ref 5–15)
BUN: 5 mg/dL — ABNORMAL LOW (ref 6–20)
CO2: 18 mmol/L — ABNORMAL LOW (ref 22–32)
Calcium: 8.9 mg/dL (ref 8.9–10.3)
Chloride: 107 mmol/L (ref 98–111)
Creatinine, Ser: 0.56 mg/dL (ref 0.44–1.00)
GFR, Estimated: >60 mL/min (ref >60)
Glucose, Bld: 102 mg/dL — ABNORMAL HIGH (ref 70–99)
Potassium: 3.7 mmol/L (ref 3.5–5.1)
Sodium: 135 mmol/L (ref 135–145)
Total Bilirubin: 0.6 mg/dL (ref 0.3–1.2)
Total Protein: 6.4 g/dL — ABNORMAL LOW (ref 6.5–8.1)

## 2021-05-07 LAB — URINALYSIS, ROUTINE W REFLEX MICROSCOPIC
Bilirubin Urine: NEGATIVE
Glucose, UA: NEGATIVE mg/dL
Ketones, ur: NEGATIVE mg/dL
Leukocytes,Ua: NEGATIVE
Nitrite: NEGATIVE
Protein, ur: NEGATIVE mg/dL
Specific Gravity, Urine: 1.008 (ref 1.005–1.030)
pH: 6 (ref 5.0–8.0)

## 2021-05-07 LAB — PROTEIN / CREATININE RATIO, URINE
Creatinine, Urine: 64.15 mg/dL
Total Protein, Urine: <6 mg/dL

## 2021-05-07 MED ORDER — ACETAMINOPHEN 500 MG PO TABS
500.0000 mg | ORAL_TABLET | Freq: Once | ORAL | Status: AC
  Administered 2021-05-07: 500 mg via ORAL
  Filled 2021-05-07: qty 1

## 2021-05-07 NOTE — Progress Notes (Signed)
HIGH-RISK PREGNANCY VISIT Patient name: Bianca Mclaughlin MRN [id number]  Date of birth: 20-Jun-2000 Chief Complaint:   Routine Prenatal Visit and High Risk Gestation (Vaginal discharge blood/ culture)  History of Present Illness:   Bianca Mclaughlin is a 21 y.o. G31P0000 female at [redacted]w[redacted]d with an Estimated Date of Delivery: 05/30/21 being seen today for ongoing management of a high-risk pregnancy complicated by: -Chronic HTN- no meds BP at home has varried, yesterday had 150/90.  Denies BP 160/110.  On/off headache, no headache currently.  No blurry vision.  No RUQ pain.   Some irregular contractions and bloody show.  Contractions: Irritability. Vag. Bleeding: Bloody Show.  Movement: Present. denies leaking of fluid.   Depression screen [facility] 2/9 03/12/2021 11/20/2020 10/01/2020 09/12/2020 05/10/2020  Decreased Interest 0 0 0 0 0  Down, Depressed, Hopeless 0 0 0 0 0  PHQ - 2 Score 0 0 0 0 0  Altered sleeping 0 1 1 - -  Tired, decreased energy 0 1 1 - -  Change in appetite 0 0 1 - -  Feeling bad or failure about yourself  0 0 0 - -  Trouble concentrating 0 1 0 - -  Moving slowly or fidgety/restless 0 0 0 - -  Suicidal thoughts 0 0 0 - -  PHQ-9 Score 0 3 3 - -  Difficult doing work/chores - - - - -     Current Outpatient Medications  Medication Instructions   aspirin 162 mg, Oral, Daily   Prenatal Vit-Fe Fumarate-FA (PRENATAL VITAMIN PO) Oral   promethazine (PHENERGAN) 25 mg, Oral, Every 6 hours PRN   Pyridoxine HCl (VITAMIN B-6 PO) Oral   triamcinolone ointment (KENALOG) 0.5 % 1 application, Topical, 2 times daily     Review of Systems:   Pertinent items are noted in HPI Denies abnormal vaginal discharge w/ itching/odor/irritation, headaches, visual changes, shortness of breath, chest pain, abdominal pain, severe nausea/vomiting, or problems with urination or bowel movements unless otherwise stated above. Pertinent History Reviewed:  Reviewed past medical,surgical, social, obstetrical and  family history.  Reviewed problem list, medications and allergies. Physical Assessment:   Vitals:   05/07/21 1455  BP: 133/89  Pulse: (!) 102  Weight: 189 lb (85.7 kg)  Body mass index is 32.44 kg/m.           Physical Examination:   General appearance: alert, well appearing, and in no distress  Mental status: normal mood, behavior, speech, dress, motor activity, and thought processes  Skin: warm & dry   Extremities: Edema: Trace    Cardiovascular: normal heart rate noted  Respiratory: normal respiratory effort, no distress  Abdomen: gravid, soft, non-tender  Pelvic: Cervical exam performed  Dilation: 2 Effacement (%): 50 Station: -3  Fetal Status: Fetal Heart Rate (bpm): 145 Fundal Height: 35 cm Movement: Present Presentation: Vertex  Fetal Surveillance Testing today: doppler   Chaperone:  partner present     No results found for this or any previous visit (from the past 24 hour(s)).   Assessment & Plan:  High-risk pregnancy: G1P0000 at 101w5d with an Estimated Date of Delivery: 05/30/21   1) chronic HTN -reviewed preeclampsia precautions -IOL scheduled for Saturday  2) GBS positive- PCN in labor   Labs/procedures today: none  Treatment Plan:  as outlined above  Reviewed: Preterm labor symptoms and general obstetric precautions including but not limited to vaginal bleeding, contractions, leaking of fluid and fetal movement were reviewed in detail with the patient.  All questions were answered. Pt has  home bp cuff. Check bp weekly, let us know if >140/90.   Follow-up: Return in about 1 week (around 05/14/2021) for next week BP check (if possible more like Friday), IOL scheduled for Sat.   Future Appointments  Date Time Provider [place]  05/10/2021 12:00 AM [facility] SCHED ROOM [facility] None  05/15/2021  9:45 AM [facility] [place] [facility] None  05/15/2021 10:50 AM Roma Schanz, CNM [facility]  05/16/2021 11:10 AM Roma Schanz, CNM [facility]     No orders of the defined types were placed in this encounter.   Janyth Pupa, DO Attending [place], [facility] for [employer], [place]

## 2021-05-07 NOTE — MAU Note (Addendum)
..  Bianca Mclaughlin is a 21 y.o. at [redacted]w[redacted]d here in MAU reporting: Elevated BP, pt reported she had a BP of 133/89 in the office today and was told to continue to monitor it at home and if it did not go down to come in to be evaluated. Pt stated her BP at home 150-160/100-138. Pt reports a HA, swelling in her arms, legs, and feet, and slight vision changes. Pt took Tylenol around 1730 and no relief to HA. Pt reports CTX since 1700 today. Pt denies Epigastric Pain, DFM, VB just bloody show and spotting for SVE today, LOF, and abnormal discharge. SVE in office was 2 cm. IOL Friday  GBS Pos  Pain score: 6/10 Vitals:   05/07/21 2140 05/07/21 2142  BP:  (!) 151/88  Pulse:  (!) 104  Resp:  18  Temp:  98.1 F (36.7 C)  SpO2: 97%      FHT:130 Lab orders placed from triage:  UA

## 2021-05-07 NOTE — MAU Provider Note (Signed)
Chief Complaint:  Hypertension   Event Date/Time   First Provider Initiated Contact with Patient 05/07/21 2212     HPI: Bianca Mclaughlin is a 21 y.o. G1P0000 at 23w5dwho presents to maternity admissions reporting elevated BP at home with new headache, visual changes and dizziness. Took 1 Tylenol at home with no relief.  She has chronic hypertension and has not been taking any antihypertensive meds. She reports good fetal movement, denies LOF, vaginal bleeding, n/v, or fever/chills.  She denies RUQ abdominal pain.  Hypertension This is a chronic problem. The current episode started today. The problem has been gradually worsening since onset. Associated symptoms include blurred vision and headaches. Pertinent negatives include no chest pain, palpitations, peripheral edema or shortness of breath. Past treatments include nothing. There are no compliance problems.    RN Note: Bianca Mclaughlin is a 21 y.o. at [redacted]w[redacted]d here in MAU reporting: Elevated BP, pt reported she had a BP of 133/89 in the office today and was told to continue to monitor it at home and if it did not go down to come in to be evaluated. Pt stated her BP at home 150-160/100-138. Pt reports a HA, swelling in her arms, legs, and feet, and slight vision changes. Pt took Tylenol around 1730 and no relief to HA. Pt reports CTX since 1700 today. Pt denies Epigastric Pain, DFM, VB just bloody show and spotting for SVE today, LOF, and abnormal discharge. SVE in office was 2 cm. IOL Friday  Past Medical History: Past Medical History:  Diagnosis Date   Acne vulgaris 02/20/2015   Asthma    Eczema    Family history of breast cancer in mother 04/15/2017   Start mammogram at 30.Marland KitchenShe is taking various herbal and vitamin products. Mom was 40   High cholesterol    Hypertension    Pregnancy induced hypertension    Seasonal allergies    Urticaria     Past obstetric history: OB History  Gravida Para Term Preterm AB Living  1 0 0 0 0 0  SAB IAB  Ectopic Multiple Live Births  0 0 0 0 0    # Outcome Date GA Lbr Len/2nd Weight Sex Delivery Anes PTL Lv  1 Current             Past Surgical History: Past Surgical History:  Procedure Laterality Date   NO PAST SURGERIES      Family History: Family History  Problem Relation Age of Onset   Hypertension Mother    Breast cancer Mother 39   Allergic rhinitis Father    Eczema Father    Urticaria Father    Hypertension Father    Eczema Sister    Breast cancer Maternal Aunt 50   Hypertension Maternal Aunt    Breast cancer Paternal Aunt 41   Asthma Neg Hx    Immunodeficiency Neg Hx     Social History: Social History   Tobacco Use   Smoking status: Never   Smokeless tobacco: Never  Vaping Use   Vaping Use: Former   Substances: Nicotine, Flavoring  Substance Use Topics   Alcohol use: Not Currently    Comment: Occasionally   Drug use: No    Allergies:  Allergies  Allergen Reactions   Other Hives, Itching, Swelling and Rash    Cat dander   Macadamia Nut Oil Hives   Tree Extract Hives    Meds:  Medications Prior to Admission  Medication Sig Dispense Refill Last Dose   aspirin 81 MG  chewable tablet Chew 2 tablets (162 mg total) by mouth daily. 60 tablet 7 05/07/2021   Prenatal Vit-Fe Fumarate-FA (PRENATAL VITAMIN PO) Take by mouth.   05/07/2021   Pyridoxine HCl (VITAMIN B-6 PO) Take by mouth.   05/07/2021   triamcinolone ointment (KENALOG) 0.5 % Apply 1 application topically 2 (two) times daily. 30 g 2 Past Week   promethazine (PHENERGAN) 25 MG tablet Take 1 tablet (25 mg total) by mouth every 6 (six) hours as needed for nausea or vomiting. (Patient not taking: Reported on 04/09/2021) 30 tablet 1 More than a month    I have reviewed patient's Past Medical Hx, Surgical Hx, Family Hx, Social Hx, medications and allergies.   ROS:  Review of Systems  Eyes:  Positive for blurred vision.  Respiratory:  Negative for shortness of breath.   Cardiovascular:  Negative for chest  pain and palpitations.  Neurological:  Positive for headaches.  Other systems negative  Physical Exam  Patient Vitals for the past 24 hrs:  BP Temp Temp src Pulse Resp SpO2 Height Weight  05/07/21 2205 (!) 148/82 -- -- (!) 107 -- 97 % -- --  05/07/21 2142 (!) 151/88 98.1 F (36.7 C) Oral (!) 104 18 -- 5\' 4"  (1.626 m) 85.9 kg  05/07/21 2140 -- -- -- -- -- 97 % -- --   Vitals:   05/07/21 2300 05/07/21 2315 05/07/21 2330 05/07/21 2345  BP: (!) 147/86 (!) 144/85 (!) 142/81 (!) 150/98  Pulse: 99 (!) 104 96 97  Resp:      Temp:      TempSrc:      SpO2: 100% 98% 98% 99%  Weight:      Height:        Constitutional: Well-developed, well-nourished female in no acute distress.  Cardiovascular: normal rate and rhythm Respiratory: normal effort GI: Abd soft, non-tender, gravid appropriate for gestational age.   No rebound or guarding. MS: Extremities nontender, no edema, normal ROM   Neurologic: Alert and oriented x 4. DTRs 2+ no clonus GU: Neg CVAT.    FHT:  Baseline 140 , moderate variability, accelerations present, no decelerations Contractions:  Irregular     Labs: Results for orders placed or performed during the hospital encounter of 05/07/21 (from the past 24 hour(s))  Protein / creatinine ratio, urine     Status: None   Collection Time: 05/07/21  9:16 PM  Result Value Ref Range   Creatinine, Urine 64.15 mg/dL   Total Protein, Urine <6 mg/dL   Protein Creatinine Ratio        0.00 - 0.15 mg/mg[Cre]  Urinalysis, Routine w reflex microscopic     Status: Abnormal   Collection Time: 05/07/21  9:16 PM  Result Value Ref Range   Color, Urine YELLOW YELLOW   APPearance CLEAR CLEAR   Specific Gravity, Urine 1.008 1.005 - 1.030   pH 6.0 5.0 - 8.0   Glucose, UA NEGATIVE NEGATIVE mg/dL   Hgb urine dipstick MODERATE (A) NEGATIVE   Bilirubin Urine NEGATIVE NEGATIVE   Ketones, ur NEGATIVE NEGATIVE mg/dL   Protein, ur NEGATIVE NEGATIVE mg/dL   Nitrite NEGATIVE NEGATIVE    Leukocytes,Ua NEGATIVE NEGATIVE   RBC / HPF 21-50 0 - 5 RBC/hpf   WBC, UA 0-5 0 - 5 WBC/hpf   Bacteria, UA RARE (A) NONE SEEN   Squamous Epithelial / LPF 0-5 0 - 5   Mucus PRESENT   CBC     Status: Abnormal   Collection Time: 05/07/21  9:40  PM  Result Value Ref Range   WBC 5.5 4.0 - 10.5 K/uL   RBC 4.15 3.87 - 5.11 MIL/uL   Hemoglobin 11.3 (L) 12.0 - 15.0 g/dL   HCT 42.7 (L) 06.2 - 37.6 %   MCV 81.7 80.0 - 100.0 fL   MCH 27.2 26.0 - 34.0 pg   MCHC 33.3 30.0 - 36.0 g/dL   RDW 28.3 (H) 15.1 - 76.1 %   Platelets 205 150 - 400 K/uL   nRBC 0.0 0.0 - 0.2 %  Comprehensive metabolic panel     Status: Abnormal   Collection Time: 05/07/21  9:40 PM  Result Value Ref Range   Sodium 135 135 - 145 mmol/L   Potassium 3.7 3.5 - 5.1 mmol/L   Chloride 107 98 - 111 mmol/L   CO2 18 (L) 22 - 32 mmol/L   Glucose, Bld 102 (H) 70 - 99 mg/dL   BUN 5 (L) 6 - 20 mg/dL   Creatinine, Ser 6.07 0.44 - 1.00 mg/dL   Calcium 8.9 8.9 - 37.1 mg/dL   Total Protein 6.4 (L) 6.5 - 8.1 g/dL   Albumin 2.9 (L) 3.5 - 5.0 g/dL   AST 28 15 - 41 U/L   ALT 17 0 - 44 U/L   Alkaline Phosphatase 100 38 - 126 U/L   Total Bilirubin 0.6 0.3 - 1.2 mg/dL   GFR, Estimated >06 >26 mL/min   Anion gap 10 5 - 15    AB/Positive/-- (08/24 1510)  Imaging:    MAU Course/MDM: I have ordered labs and reviewed results. These are normal  NST reviewed, reassuring Consult Dr Donavan Foil with presentation, exam findings and test results. Since headache improved, may discharge home Treatments in MAU included Tylenol.    Assessment: Single IUP at [redacted]w[redacted]d Chronic hypertension Headache, improved with Tylenol No severe features  Plan: Discharge home Strict preeclampsia precautions Labor precautions and fetal kick counts Follow up in Office for prenatal visits and recheck Encouraged to return if she develops worsening of symptoms, increase in pain, fever, or other concerning symptoms.  Pt stable at time of discharge.  Wynelle Bourgeois  CNM, MSN Certified Nurse-Midwife 05/07/2021 10:12 PM

## 2021-05-07 NOTE — Addendum Note (Signed)
Addended by: Diona Fanti A on: 05/07/2021 03:26 PM   Modules accepted: Orders

## 2021-05-08 ENCOUNTER — Encounter (HOSPITAL_COMMUNITY): Payer: Self-pay | Admitting: Family Medicine

## 2021-05-08 ENCOUNTER — Inpatient Hospital Stay (HOSPITAL_COMMUNITY): Payer: Managed Care, Other (non HMO) | Admitting: Anesthesiology

## 2021-05-08 ENCOUNTER — Inpatient Hospital Stay (HOSPITAL_COMMUNITY)
Admission: AD | Admit: 2021-05-08 | Discharge: 2021-05-10 | DRG: 807 | Disposition: A | Discharge status: Home / Self Care | Payer: Managed Care, Other (non HMO) | Attending: Obstetrics and Gynecology | Admitting: Obstetrics and Gynecology

## 2021-05-08 ENCOUNTER — Encounter: Payer: Self-pay | Admitting: Advanced Practice Midwife

## 2021-05-08 ENCOUNTER — Encounter: Payer: Managed Care, Other (non HMO) | Admitting: Advanced Practice Midwife

## 2021-05-08 ENCOUNTER — Ambulatory Visit (INDEPENDENT_AMBULATORY_CARE_PROVIDER_SITE_OTHER): Payer: Managed Care, Other (non HMO) | Admitting: Advanced Practice Midwife

## 2021-05-08 DIAGNOSIS — O1002 Pre-existing essential hypertension complicating childbirth: Principal | ICD-10-CM | POA: Diagnosis present

## 2021-05-08 DIAGNOSIS — O9952 Diseases of the respiratory system complicating childbirth: Secondary | ICD-10-CM | POA: Diagnosis present

## 2021-05-08 DIAGNOSIS — Z7982 Long term (current) use of aspirin: Secondary | ICD-10-CM

## 2021-05-08 DIAGNOSIS — O99824 Streptococcus B carrier state complicating childbirth: Secondary | ICD-10-CM | POA: Diagnosis present

## 2021-05-08 DIAGNOSIS — Z148 Genetic carrier of other disease: Secondary | ICD-10-CM

## 2021-05-08 DIAGNOSIS — Z3A36 36 weeks gestation of pregnancy: Secondary | ICD-10-CM

## 2021-05-08 DIAGNOSIS — O0990 Supervision of high risk pregnancy, unspecified, unspecified trimester: Secondary | ICD-10-CM

## 2021-05-08 DIAGNOSIS — J4530 Mild persistent asthma, uncomplicated: Secondary | ICD-10-CM | POA: Diagnosis present

## 2021-05-08 DIAGNOSIS — O42913 Preterm premature rupture of membranes, unspecified as to length of time between rupture and onset of labor, third trimester: Secondary | ICD-10-CM

## 2021-05-08 DIAGNOSIS — O164 Unspecified maternal hypertension, complicating childbirth: Secondary | ICD-10-CM | POA: Diagnosis not present

## 2021-05-08 DIAGNOSIS — O169 Unspecified maternal hypertension, unspecified trimester: Secondary | ICD-10-CM

## 2021-05-08 DIAGNOSIS — O26893 Other specified pregnancy related conditions, third trimester: Secondary | ICD-10-CM | POA: Diagnosis present

## 2021-05-08 DIAGNOSIS — B951 Streptococcus, group B, as the cause of diseases classified elsewhere: Secondary | ICD-10-CM | POA: Diagnosis present

## 2021-05-08 HISTORY — DX: Unspecified maternal hypertension, unspecified trimester: O16.9

## 2021-05-08 LAB — CBC
HCT: 34.5 % — ABNORMAL LOW (ref 36.0–46.0)
Hemoglobin: 11.6 g/dL — ABNORMAL LOW (ref 12.0–15.0)
MCH: 27.8 pg (ref 26.0–34.0)
MCHC: 33.6 g/dL (ref 30.0–36.0)
MCV: 82.7 fL (ref 80.0–100.0)
Platelets: 214 10*3/uL (ref 150–400)
RBC: 4.17 MIL/uL (ref 3.87–5.11)
RDW: 16.6 % — ABNORMAL HIGH (ref 11.5–15.5)
WBC: 5.2 10*3/uL (ref 4.0–10.5)
nRBC: 0 % (ref 0.0–0.2)

## 2021-05-08 LAB — TYPE AND SCREEN
ABO/RH(D): AB POS
Antibody Screen: NEGATIVE

## 2021-05-08 LAB — POCT FERN TEST: POCT Fern Test: POSITIVE

## 2021-05-08 MED ORDER — PHENYLEPHRINE 40 MCG/ML (10ML) SYRINGE FOR IV PUSH (FOR BLOOD PRESSURE SUPPORT): 80.0000 ug | PREFILLED_SYRINGE | INTRAVENOUS | Status: DC | PRN

## 2021-05-08 MED ORDER — FENTANYL CITRATE (PF) 100 MCG/2ML IJ SOLN
100.0000 ug | INTRAMUSCULAR | Status: DC | PRN
  Administered 2021-05-08 (×2): 100 ug via INTRAVENOUS
  Filled 2021-05-08 (×2): qty 2

## 2021-05-08 MED ORDER — EPHEDRINE 5 MG/ML INJ: 10.0000 mg | INTRAVENOUS | Status: DC | PRN

## 2021-05-08 MED ORDER — FENTANYL-BUPIVACAINE-NACL 0.5-0.125-0.9 MG/250ML-% EP SOLN
EPIDURAL | Status: DC | PRN
  Administered 2021-05-08: 12 mL/h via EPIDURAL

## 2021-05-08 MED ORDER — OXYTOCIN-SODIUM CHLORIDE 30-0.9 UT/500ML-% IV SOLN
2.5000 [IU]/h | INTRAVENOUS | Status: DC
  Administered 2021-05-09: 2.5 [IU]/h via INTRAVENOUS
  Filled 2021-05-08: qty 500

## 2021-05-08 MED ORDER — PENICILLIN G POT IN DEXTROSE 60000 UNIT/ML IV SOLN
3.0000 10*6.[IU] | INTRAVENOUS | Status: DC
  Filled 2021-05-08: qty 50

## 2021-05-08 MED ORDER — OXYCODONE-ACETAMINOPHEN 5-325 MG PO TABS: 1.0000 | ORAL_TABLET | ORAL | Status: DC | PRN

## 2021-05-08 MED ORDER — SOD CITRATE-CITRIC ACID 500-334 MG/5ML PO SOLN: 30.0000 mL | ORAL | Status: DC | PRN

## 2021-05-08 MED ORDER — OXYCODONE-ACETAMINOPHEN 5-325 MG PO TABS: 2.0000 | ORAL_TABLET | ORAL | Status: DC | PRN

## 2021-05-08 MED ORDER — LACTATED RINGERS IV SOLN: 500.0000 mL | Freq: Once | INTRAVENOUS | Status: DC

## 2021-05-08 MED ORDER — LIDOCAINE HCL (PF) 1 % IJ SOLN
INTRAMUSCULAR | Status: DC | PRN
  Administered 2021-05-08: 5 mL via EPIDURAL

## 2021-05-08 MED ORDER — FENTANYL-BUPIVACAINE-NACL 0.5-0.125-0.9 MG/250ML-% EP SOLN: 12.0000 mL/h | EPIDURAL | Status: DC | PRN

## 2021-05-08 MED ORDER — TERBUTALINE SULFATE 1 MG/ML IJ SOLN: 0.2500 mg | Freq: Once | INTRAMUSCULAR | Status: DC | PRN

## 2021-05-08 MED ORDER — DIPHENHYDRAMINE HCL 50 MG/ML IJ SOLN: 12.5000 mg | INTRAMUSCULAR | Status: DC | PRN

## 2021-05-08 MED ORDER — LACTATED RINGERS IV SOLN: 500.0000 mL | INTRAVENOUS | Status: DC | PRN

## 2021-05-08 MED ORDER — MISOPROSTOL 25 MCG QUARTER TABLET: 25.0000 ug | ORAL_TABLET | ORAL | Status: DC | PRN

## 2021-05-08 MED ORDER — LACTATED RINGERS IV SOLN: INTRAVENOUS | Status: DC

## 2021-05-08 MED ORDER — OXYTOCIN BOLUS FROM INFUSION
333.0000 mL | Freq: Once | INTRAVENOUS | Status: AC
  Administered 2021-05-08: 333 mL via INTRAVENOUS

## 2021-05-08 MED ORDER — SODIUM CHLORIDE 0.9 % IV SOLN
5.0000 10*6.[IU] | Freq: Once | INTRAVENOUS | Status: AC
  Administered 2021-05-08: 5 10*6.[IU] via INTRAVENOUS
  Filled 2021-05-08: qty 5

## 2021-05-08 MED ORDER — FLEET ENEMA 7-19 GM/118ML RE ENEM: 1.0000 | ENEMA | RECTAL | Status: DC | PRN

## 2021-05-08 MED ORDER — MISOPROSTOL 50MCG HALF TABLET
50.0000 ug | ORAL_TABLET | ORAL | Status: DC
  Administered 2021-05-08: 50 ug via BUCCAL
  Filled 2021-05-08: qty 1

## 2021-05-08 MED ORDER — LIDOCAINE HCL (PF) 1 % IJ SOLN: 30.0000 mL | INTRAMUSCULAR | Status: DC | PRN

## 2021-05-08 MED ORDER — FENTANYL-BUPIVACAINE-NACL 0.5-0.125-0.9 MG/250ML-% EP SOLN
EPIDURAL | Status: AC
  Filled 2021-05-08: qty 250

## 2021-05-08 MED ORDER — ONDANSETRON HCL 4 MG/2ML IJ SOLN
4.0000 mg | Freq: Four times a day (QID) | INTRAMUSCULAR | Status: DC | PRN
  Administered 2021-05-08: 4 mg via INTRAVENOUS
  Filled 2021-05-08: qty 2

## 2021-05-08 MED ORDER — ACETAMINOPHEN 325 MG PO TABS: 650.0000 mg | ORAL_TABLET | ORAL | Status: DC | PRN

## 2021-05-08 NOTE — H&P (Addendum)
OBSTETRIC ADMISSION HISTORY AND PHYSICAL Bianca HarvestDiamond Mclaughlin is a 21 y.o. female G1P0000 with IUP at 6698w6d by LMP presenting for SROM, today at 1600. She reports +Fms and no VB.  She has a hx of cHTN-no meds.  Home BP's 150's/90's.  She tolerated bASA. She was seen in MAU yesterday for HA, RUQ, vision changes and dizziness. PreE labs were negative. But no blurry vision, headaches or peripheral edema, and RUQ pain on admission today.  She plans on breastfeeding. She request POP for birth control. [facility]   Dating: By LMP --->  Estimated Date of Delivery: 05/30/21  Sono:   @[redacted]w[redacted]d , CWD, normal anatomy, cephalic presentation, 2258 g, 71% EFW   Prenatal History/Complications:  -cHTN-no meds -asthma-no meds and no recent attacks  Past Medical History: Past Medical History:  Diagnosis Date   Acne vulgaris 02/20/2015   Asthma    Eczema    Family history of breast cancer in mother 04/15/2017   Start mammogram at 30.Marland Kitchen.She is taking various herbal and vitamin products. Mom was 40   High cholesterol    Hypertension    Pregnancy induced hypertension    Seasonal allergies    Urticaria     Past Surgical History: Past Surgical History:  Procedure Laterality Date   NO PAST SURGERIES      Obstetrical History: OB History     Gravida  1   Para  0   Term  0   Preterm  0   AB  0   Living  0      SAB  0   IAB  0   Ectopic  0   Multiple  0   Live Births  0           Social History Social History   Socioeconomic History   Marital status: Married    Spouse name: Not on file   Number of children: Not on file   Years of education: Not on file   Highest education level: Not on file  Occupational History    Comment: [employer]  Tobacco Use   Smoking status: Never   Smokeless tobacco: Never  Vaping Use   Vaping Use: Former   Substances: Nicotine, Flavoring  Substance and Sexual Activity   Alcohol use: Not Currently    Comment:  Occasionally   Drug use: No   Sexual activity: Yes    Birth control/protection: None  Other Topics Concern   Not on file  Social History Narrative   Not on file   Social Determinants of Health   Financial Resource Strain: Low Risk    Difficulty of Paying Living Expenses: Not very hard  Food Insecurity: No Food Insecurity   Worried About Programme researcher, broadcasting/film/videounning Out of Food in the Last Year: Never true   Ran Out of Food in the Last Year: Never true  Transportation Needs: No Transportation Needs   Lack of Transportation (Medical): No   Lack of Transportation (Non-Medical): No  Physical Activity: Insufficiently Active   Days of Exercise per Week: 2 days   Minutes of Exercise per Session: 30 min  Stress: No Stress Concern Present   Feeling of Stress : Only a little  Social Connections: Moderately Integrated   Frequency of Communication with Friends and Family: Three times a week   Frequency of Social Gatherings with Friends and Family: Twice a week   Attends Religious Services: 1 to 4 times per year   Active Member of [employer]  or Organizations: No   Attends Banker Meetings: Never   Marital Status: Married    Family History: Family History  Problem Relation Age of Onset   Hypertension Mother    Breast cancer Mother 62   Allergic rhinitis Father    Eczema Father    Urticaria Father    Hypertension Father    Eczema Sister    Breast cancer Maternal Aunt 50   Hypertension Maternal Aunt    Breast cancer Paternal Aunt 18   Asthma Neg Hx    Immunodeficiency Neg Hx     Allergies: Allergies  Allergen Reactions   Other Hives, Itching, Swelling and Rash    Cat dander   Macadamia Nut Oil Hives   Tree Extract Hives    Medications Prior to Admission  Medication Sig Dispense Refill Last Dose   aspirin 81 MG chewable tablet Chew 2 tablets (162 mg total) by mouth daily. 60 tablet 7    Prenatal Vit-Fe Fumarate-FA (PRENATAL VITAMIN PO) Take by mouth.      promethazine (PHENERGAN) 25  MG tablet Take 1 tablet (25 mg total) by mouth every 6 (six) hours as needed for nausea or vomiting. (Patient not taking: Reported on 04/09/2021) 30 tablet 1    Pyridoxine HCl (VITAMIN B-6 PO) Take by mouth.      triamcinolone ointment (KENALOG) 0.5 % Apply 1 application topically 2 (two) times daily. 30 g 2      Review of Systems   All systems reviewed and negative except as stated in HPI  Blood pressure (!) 151/107, pulse (!) 115, temperature 98.7 F (37.1 C), height 5\' 4"  (1.626 m), weight 85.7 kg, last menstrual period 08/23/2020. General appearance: alert, cooperative, appears stated age, and no distress Lungs: clear to auscultation bilaterally Heart: RRR w/o M/G/R Abdomen: soft, non-tender; bowel sounds normal.  Leopold's: vertex on exam.  Pelvic: deferred at this time.  Extremities: Homans sign is negative, no sign of DVT DTR's 2+, bilaterally.  No clonus.  Fetal monitoringBaseline: 140's bpm, Variability: Good {> 6 bpm), Accelerations: Reactive, and Decelerations: Absent Uterine activityNone     Prenatal labs: ABO, Rh: --/--/AB POS (02/09 1800) Antibody: NEG (02/09 1800) Rubella: >33.00 (08/24 1510) RPR: Non Reactive (12/14 0807)  HBsAg: Negative (08/24 1510)  HIV: Non Reactive (12/14 0807)  GBS:   positive 2 hr Glucola: WNL Genetic screening  Panorama: LR, +SMA Anatomy 09-10-1994 completed and appears normal  Prenatal Transfer Tool  Maternal Diabetes: No Genetic Screening: Normal Maternal Ultrasounds/Referrals: Normal Fetal Ultrasounds or other Referrals:  None Maternal Substance Abuse:  No Significant Maternal Medications:  Meds include: Other: bASA Significant Maternal Lab Results: Group B Strep positive  Results for orders placed or performed during the hospital encounter of 05/08/21 (from the past 24 hour(s))  CBC   Collection Time: 05/08/21  6:00 PM  Result Value Ref Range   WBC 5.2 4.0 - 10.5 K/uL   RBC 4.17 3.87 - 5.11 MIL/uL   Hemoglobin 11.6 (L) 12.0 - 15.0  g/dL   HCT 07/06/21 (L) 94.7 - 65.4 %   MCV 82.7 80.0 - 100.0 fL   MCH 27.8 26.0 - 34.0 pg   MCHC 33.6 30.0 - 36.0 g/dL   RDW 65.0 (H) 35.4 - 65.6 %   Platelets 214 150 - 400 K/uL   nRBC 0.0 0.0 - 0.2 %  Type and screen   Collection Time: 05/08/21  6:00 PM  Result Value Ref Range   ABO/RH(D) AB POS  Antibody Screen NEG    Sample Expiration      05/11/2021,2359 Performed at [facility], [address] [place], [place] [address]   Results for orders placed or performed in visit on 05/08/21 (from the past 24 hour(s))  POCT fern test   Collection Time: 05/08/21  4:53 PM  Result Value Ref Range   POCT Fern Test Positive = ruptured amniotic membanes   Results for orders placed or performed during the hospital encounter of 05/07/21 (from the past 24 hour(s))  Protein / creatinine ratio, urine   Collection Time: 05/07/21  9:16 PM  Result Value Ref Range   Creatinine, Urine 64.15 mg/dL   Total Protein, Urine <6 mg/dL   Protein Creatinine Ratio        0.00 - 0.15 mg/mg[Cre]  Urinalysis, Routine w reflex microscopic   Collection Time: 05/07/21  9:16 PM  Result Value Ref Range   Color, Urine YELLOW YELLOW   APPearance CLEAR CLEAR   Specific Gravity, Urine 1.008 1.005 - 1.030   pH 6.0 5.0 - 8.0   Glucose, UA NEGATIVE NEGATIVE mg/dL   Hgb urine dipstick MODERATE (A) NEGATIVE   Bilirubin Urine NEGATIVE NEGATIVE   Ketones, ur NEGATIVE NEGATIVE mg/dL   Protein, ur NEGATIVE NEGATIVE mg/dL   Nitrite NEGATIVE NEGATIVE   Leukocytes,Ua NEGATIVE NEGATIVE   RBC / HPF 21-50 0 - 5 RBC/hpf   WBC, UA 0-5 0 - 5 WBC/hpf   Bacteria, UA RARE (A) NONE SEEN   Squamous Epithelial / LPF 0-5 0 - 5   Mucus PRESENT   CBC   Collection Time: 05/07/21  9:40 PM  Result Value Ref Range   WBC 5.5 4.0 - 10.5 K/uL   RBC 4.15 3.87 - 5.11 MIL/uL   Hemoglobin 11.3 (L) 12.0 - 15.0 g/dL   HCT 60.4 (L) 54.0 - 98.1 %   MCV 81.7 80.0 - 100.0 fL   MCH 27.2 26.0 - 34.0 pg   MCHC 33.3 30.0 - 36.0 g/dL   RDW  19.1 (H) 47.8 - 15.5 %   Platelets 205 150 - 400 K/uL   nRBC 0.0 0.0 - 0.2 %  Comprehensive metabolic panel   Collection Time: 05/07/21  9:40 PM  Result Value Ref Range   Sodium 135 135 - 145 mmol/L   Potassium 3.7 3.5 - 5.1 mmol/L   Chloride 107 98 - 111 mmol/L   CO2 18 (L) 22 - 32 mmol/L   Glucose, Bld 102 (H) 70 - 99 mg/dL   BUN 5 (L) 6 - 20 mg/dL   Creatinine, Ser 2.95 0.44 - 1.00 mg/dL   Calcium 8.9 8.9 - 62.1 mg/dL   Total Protein 6.4 (L) 6.5 - 8.1 g/dL   Albumin 2.9 (L) 3.5 - 5.0 g/dL   AST 28 15 - 41 U/L   ALT 17 0 - 44 U/L   Alkaline Phosphatase 100 38 - 126 U/L   Total Bilirubin 0.6 0.3 - 1.2 mg/dL   GFR, Estimated >30 >86 mL/min   Anion gap 10 5 - 15    Patient Active Problem List   Diagnosis Date Noted   Hypertension in pregnancy, antepartum 05/08/2021   Positive GBS test 05/07/2021   Carrier of spinal muscular atrophy 12/03/2020   Supervision of high risk pregnancy, antepartum 11/20/2020   Chronic hypertension 09/12/2020   Mixed hyperlipidemia 09/12/2020   Vitamin D deficiency 09/12/2020   Environmental allergies 02/07/2020   Eczema 02/07/2020   Family history of breast cancer in mother  04/15/2017   Mild persistent asthma 02/20/2015   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 G1P0000 at [redacted]w[redacted]d here for SROM.  She has a hx of cHTN-no meds and asthma-well controlled.   #Labor:SROM, SVE deferred at this time, discussed Pitocin vs. Cytotec pending SVE.  Reviewed R/B/SE of both medications.  Pt open to provider recommendations.  #cHTN: Elevated BP's on admission (150's/90's). CBC/CMP: WNL on 05/07/2021. Repeat CBC on admission: normal range. CMP ordered. Pt asymptomatic on admission.  UPCR: deferred at this time, given SROM, plan to collect if pt receive epidural via foley cath. Will continue to assess.  #Pain: Considering epidural, discussed IV pain meds and nitrous oxide. #FWB: Category 1 #ID:  GBS:  positive: PCN per protocol #MOF: Breastfeeding #MOC:POP   Bianca Mclaughlin, Student-MidWife  05/08/2021, 7:06 PM  When I came in to evaluate pt/options, she requested VE:4.5/90/-1.  Wants to try a cytotec. Will give 50 mcg PO. Bianca Mclaughlin

## 2021-05-08 NOTE — Progress Notes (Signed)
W/I for leaking fluid for 30 mintues. Fluid seen coming out of vagina, clear.  Initial slide collected by RN didn't fern, so SSE performed.  + pooling, + valsalva and + fern. Cx exam deferred.  To hospital for direct admit

## 2021-05-08 NOTE — Anesthesia Preprocedure Evaluation (Signed)
Anesthesia Evaluation  Patient identified by MRN, date of birth, ID band Patient awake    Reviewed: Allergy & Precautions, NPO status , Patient's Chart, lab work & pertinent test results  Airway Mallampati: II  TM Distance: >3 FB Neck ROM: Full    Dental no notable dental hx. (+) Teeth Intact, Dental Advisory Given   Pulmonary asthma ,    Pulmonary exam normal breath sounds clear to auscultation       Cardiovascular hypertension (PIH), Normal cardiovascular exam Rhythm:Regular Rate:Normal     Neuro/Psych negative neurological ROS     GI/Hepatic Neg liver ROS,   Endo/Other  negative endocrine ROS  Renal/GU negative Renal ROS     Musculoskeletal   Abdominal   Peds  Hematology Lab Results      Component                Value               Date                      WBC                      5.2                 05/08/2021                HGB                      11.6 (L)            05/08/2021                HCT                      34.5 (L)            05/08/2021                MCV                      82.7                05/08/2021                PLT                      214                 05/08/2021              Anesthesia Other Findings   Reproductive/Obstetrics (+) Pregnancy                            Anesthesia Physical Anesthesia Plan  ASA: 3  Anesthesia Plan: Epidural   Post-op Pain Management:    Induction:   PONV Risk Score and Plan:   Airway Management Planned:   Additional Equipment:   Intra-op Plan:   Post-operative Plan:   Informed Consent: I have reviewed the patients History and Physical, chart, labs and discussed the procedure including the risks, benefits and alternatives for the proposed anesthesia with the patient or authorized representative who has indicated his/her understanding and acceptance.       Plan Discussed with:   Anesthesia Plan Comments:  (36.6 wk Primagravida w PIH for LAE)  Anesthesia Quick Evaluation

## 2021-05-08 NOTE — Anesthesia Procedure Notes (Addendum)
Epidural Patient location during procedure: OB Start time: 05/08/2021 10:28 PM End time: 05/08/2021 10:40 PM  Staffing Anesthesiologist: Trevor Iha, MD Performed: anesthesiologist   Preanesthetic Checklist Completed: patient identified, IV checked, site marked, risks and benefits discussed, surgical consent, monitors and equipment checked, pre-op evaluation and timeout performed  Epidural Patient position: sitting Prep: DuraPrep and site prepped and draped Patient monitoring: continuous pulse ox and blood pressure Approach: midline Location: L3-L4 Injection technique: LOR air  Needle:  Needle type: Tuohy  Needle gauge: 17 G Needle length: 9 cm and 9 Needle insertion depth: 6 cm Catheter type: closed end flexible Catheter size: 19 Gauge Catheter at skin depth: 12 cm Test dose: negative  Assessment Events: blood not aspirated, injection not painful, no injection resistance, no paresthesia and negative IV test  Additional Notes Patient identified. Risks/Benefits/Options discussed with patient including but not limited to bleeding, infection, nerve damage, paralysis, failed block, incomplete pain control, headache, blood pressure changes, nausea, vomiting, reactions to medication both or allergic, itching and postpartum back pain. Confirmed with bedside nurse the patient's most recent platelet count. Confirmed with patient that they are not currently taking any anticoagulation, have any bleeding history or any family history of bleeding disorders. Patient expressed understanding and wished to proceed. All questions were answered. Sterile technique was used throughout the entire procedure. Please see nursing notes for vital signs. Test dose was given through epidural needle and negative prior to continuing to dose epidural or start infusion. Warning signs of high block given to the patient including shortness of breath, tingling/numbness in hands, complete motor block, or any  concerning symptoms with instructions to call for help. Patient was given instructions on fall risk and not to get out of bed. All questions and concerns addressed with instructions to call with any issues.  1 Attempt (S) . Patient tolerated procedure well.

## 2021-05-09 ENCOUNTER — Other Ambulatory Visit: Payer: Self-pay

## 2021-05-09 ENCOUNTER — Encounter (HOSPITAL_COMMUNITY): Payer: Self-pay | Admitting: Family Medicine

## 2021-05-09 ENCOUNTER — Other Ambulatory Visit (HOSPITAL_COMMUNITY): Payer: Self-pay

## 2021-05-09 LAB — CULTURE, OB URINE

## 2021-05-09 LAB — RPR: RPR Ser Ql: NONREACTIVE

## 2021-05-09 MED ORDER — FUROSEMIDE 20 MG PO TABS
20.0000 mg | ORAL_TABLET | Freq: Two times a day (BID) | ORAL | Status: DC
  Administered 2021-05-09 – 2021-05-10 (×3): 20 mg via ORAL
  Filled 2021-05-09 (×3): qty 1

## 2021-05-09 MED ORDER — DIPHENHYDRAMINE HCL 25 MG PO CAPS: 25.0000 mg | ORAL_CAPSULE | Freq: Four times a day (QID) | ORAL | Status: DC | PRN

## 2021-05-09 MED ORDER — DIBUCAINE (PERIANAL) 1 % EX OINT: 1.0000 "application " | TOPICAL_OINTMENT | CUTANEOUS | Status: DC | PRN

## 2021-05-09 MED ORDER — IBUPROFEN 600 MG PO TABS
600.0000 mg | ORAL_TABLET | Freq: Four times a day (QID) | ORAL | 0 refills | Status: DC
  Filled 2021-05-09: qty 30, 8d supply, fill #0

## 2021-05-09 MED ORDER — BENZOCAINE-MENTHOL 20-0.5 % EX AERO
1.0000 "application " | INHALATION_SPRAY | CUTANEOUS | Status: DC | PRN
  Filled 2021-05-09: qty 56

## 2021-05-09 MED ORDER — FUROSEMIDE 20 MG PO TABS
20.0000 mg | ORAL_TABLET | Freq: Two times a day (BID) | ORAL | 0 refills | Status: DC
  Filled 2021-05-09: qty 3, 2d supply, fill #0

## 2021-05-09 MED ORDER — MEDROXYPROGESTERONE ACETATE 150 MG/ML IM SUSP: 150.0000 mg | INTRAMUSCULAR | Status: DC | PRN

## 2021-05-09 MED ORDER — ACETAMINOPHEN 325 MG PO TABS: 650.0000 mg | ORAL_TABLET | ORAL | Status: DC | PRN

## 2021-05-09 MED ORDER — NIFEDIPINE ER 30 MG PO TB24
30.0000 mg | ORAL_TABLET | Freq: Every day | ORAL | 0 refills | Status: DC
  Filled 2021-05-09: qty 30, 30d supply, fill #0

## 2021-05-09 MED ORDER — IBUPROFEN 600 MG PO TABS
600.0000 mg | ORAL_TABLET | Freq: Four times a day (QID) | ORAL | Status: DC
  Administered 2021-05-09 – 2021-05-10 (×6): 600 mg via ORAL
  Filled 2021-05-09 (×6): qty 1

## 2021-05-09 MED ORDER — FLEET ENEMA 7-19 GM/118ML RE ENEM: 1.0000 | ENEMA | Freq: Every day | RECTAL | Status: DC | PRN

## 2021-05-09 MED ORDER — WITCH HAZEL-GLYCERIN EX PADS: 1.0000 "application " | MEDICATED_PAD | CUTANEOUS | Status: DC | PRN

## 2021-05-09 MED ORDER — COCONUT OIL OIL
1.0000 "application " | TOPICAL_OIL | Status: DC | PRN
  Administered 2021-05-09: 1 via TOPICAL

## 2021-05-09 MED ORDER — TETANUS-DIPHTH-ACELL PERTUSSIS 5-2.5-18.5 LF-MCG/0.5 IM SUSY: 0.5000 mL | PREFILLED_SYRINGE | Freq: Once | INTRAMUSCULAR | Status: DC

## 2021-05-09 MED ORDER — FERROUS SULFATE 325 (65 FE) MG PO TABS
325.0000 mg | ORAL_TABLET | ORAL | Status: DC
  Administered 2021-05-09: 325 mg via ORAL
  Filled 2021-05-09: qty 1

## 2021-05-09 MED ORDER — DOCUSATE SODIUM 100 MG PO CAPS
100.0000 mg | ORAL_CAPSULE | Freq: Two times a day (BID) | ORAL | Status: DC
  Administered 2021-05-10 (×2): 100 mg via ORAL
  Filled 2021-05-09 (×2): qty 1

## 2021-05-09 MED ORDER — METHYLERGONOVINE MALEATE 0.2 MG/ML IJ SOLN: 0.2000 mg | INTRAMUSCULAR | Status: DC | PRN

## 2021-05-09 MED ORDER — SIMETHICONE 80 MG PO CHEW: 80.0000 mg | CHEWABLE_TABLET | ORAL | Status: DC | PRN

## 2021-05-09 MED ORDER — BISACODYL 10 MG RE SUPP: 10.0000 mg | Freq: Every day | RECTAL | Status: DC | PRN

## 2021-05-09 MED ORDER — ONDANSETRON HCL 4 MG PO TABS: 4.0000 mg | ORAL_TABLET | ORAL | Status: DC | PRN

## 2021-05-09 MED ORDER — ONDANSETRON HCL 4 MG/2ML IJ SOLN: 4.0000 mg | INTRAMUSCULAR | Status: DC | PRN

## 2021-05-09 MED ORDER — METHYLERGONOVINE MALEATE 0.2 MG PO TABS: 0.2000 mg | ORAL_TABLET | ORAL | Status: DC | PRN

## 2021-05-09 MED ORDER — PRENATAL MULTIVITAMIN CH
1.0000 | ORAL_TABLET | Freq: Every day | ORAL | Status: DC
  Administered 2021-05-09 – 2021-05-10 (×2): 1 via ORAL
  Filled 2021-05-09 (×2): qty 1

## 2021-05-09 MED ORDER — MEASLES, MUMPS & RUBELLA VAC IJ SOLR: 0.5000 mL | Freq: Once | INTRAMUSCULAR | Status: DC

## 2021-05-09 MED ORDER — NIFEDIPINE ER OSMOTIC RELEASE 30 MG PO TB24
30.0000 mg | ORAL_TABLET | Freq: Every day | ORAL | Status: DC
  Administered 2021-05-09 – 2021-05-10 (×2): 30 mg via ORAL
  Filled 2021-05-09 (×2): qty 1

## 2021-05-09 NOTE — Progress Notes (Signed)
Post Partum Day 1 Subjective: no complaints, up ad lib, voiding and tolerating PO, small lochia, plans to breastfeed, oral progesterone-only contraceptive  Objective: Blood pressure 131/82, pulse 92, temperature 98.3 F (36.8 C), temperature source Oral, resp. rate 16, height 5\' 4"  (1.626 m), weight 85.7 kg, last menstrual period 08/23/2020, SpO2 97 %, unknown if currently breastfeeding.  Physical Exam:  General: alert, cooperative and no distress Lochia:normal flow Chest: CTAB Heart: RRR no m/r/g Abdomen: +BS, soft, nontender,  Uterine Fundus: firm DVT Evaluation: No evidence of DVT seen on physical exam. Extremities: trace edema  Recent Labs    05/07/21 2140 05/08/21 1800  HGB 11.3* 11.6*  HCT 33.9* 34.5*    Assessment/Plan: PPD#1, CHTN, persistent elevated BPs: start Procardia/lasix   LOS: 1 day   07/06/21 05/09/2021, 8:22 AM

## 2021-05-09 NOTE — Lactation Note (Signed)
This note was copied from a baby's chart. Lactation Consultation Note  Patient Name: Bianca Mclaughlin Date: 05/09/2021 Reason for consult: Follow-up assessment;1st time breastfeeding;Primapara;Early term 37-38.6wks Age:21 years  Visited with mom of 18 hours old ETI NICU female, she's a P1; previous LC request a LATCH for this baby, mom has been supplementing with donor due to LPI status at birth.   LC took baby STS to the left breast in cross cradle hold and she was able to easily latch right away. She unlatched a couple of times, but easily repositioned and continued suckling (see LATCH score). Reviewed feeding cues, normal LPI behavior, LPI guidelines/supplementation and size of baby's stomach.  Feeding Mother's Current Feeding Choice: Breast Milk and Donor Milk  LATCH Score Latch: Grasps breast easily, tongue down, lips flanged, rhythmical sucking. (required some repositioning though, baby actively sucking for 5 minutes before falling asleep at the breast)  Audible Swallowing: A few with stimulation  Type of Nipple: Everted at rest and after stimulation (slightly short shafted due to areolar edema)  Comfort (Breast/Nipple): Soft / non-tender  Hold (Positioning): Assistance needed to correctly position infant at breast and maintain latch.  LATCH Score: 8  Lactation Tools Discussed/Used Tools: Pump;Flanges;Coconut oil;Shells Flange Size: 24 Breast pump type: Manual;Double-Electric Breast Pump Pump Education: Setup, frequency, and cleaning;Milk Storage Reason for Pumping: ETI Pumping frequency: whenever baby is getting a bottle, she's currently on donor milk Pumped volume:  (drops)  Interventions Interventions: Breast feeding basics reviewed;Assisted with latch;Skin to skin;Breast massage;Hand express;Support pillows;Position options;Coconut oil;Shells;Hand pump;DEBP;Breast compression;Adjust position;Education  Plan of care Encouraged mom to continue putting baby  to breast 8-12 times/24 hours whenever feeding cues are present If baby is not cueing or still shows signs of hunger after feedings at the breast, parents will continue supplementing with donor milk. Parents aware amounts will increase every 24 hours for the rest of baby's stay Mom will pump whenever baby is getting a bottle, she understands the importance of consistent pumping to protect her supply  FOB and family members present and very supportive. All questions and concerns answered, parents to contact lactation services PRN.  Discharge Pump: DEBP;Personal (Medela DEBP at home)  Consult Status Consult Status: Follow-up Date: 05/10/21 Follow-up type: In-patient   Bianca Mclaughlin Bianca Mclaughlin 05/09/2021, 6:09 PM

## 2021-05-09 NOTE — Anesthesia Postprocedure Evaluation (Signed)
Anesthesia Post Note  Patient: [name]  Procedure(s) Performed: AN AD HOC LABOR EPIDURAL     Patient location during evaluation: Mother Baby Anesthesia Type: Epidural Level of consciousness: awake, oriented and awake and alert Pain management: pain level controlled Vital Signs Assessment: post-procedure vital signs reviewed and stable Respiratory status: spontaneous breathing, respiratory function stable and nonlabored ventilation Cardiovascular status: stable Postop Assessment: adequate PO intake, no headache, able to ambulate, patient able to bend at knees and no apparent nausea or vomiting Anesthetic complications: no   No notable events documented.  Last Vitals:  Vitals:   05/09/21 0239 05/09/21 0629  BP: (!) 146/81 131/82  Pulse: (!) 110 92  Resp: 18 16  Temp: 36.9 C 36.8 C  SpO2: 99% 97%    Last Pain:  Vitals:   05/09/21 0716  TempSrc:   PainSc: Asleep   Pain Goal: Patients Stated Pain Goal: 0 (05/08/21 2225)                 Bianca Mclaughlin

## 2021-05-09 NOTE — Lactation Note (Signed)
This note was copied from a baby's chart. Lactation Consultation Note  Patient Name: Bianca Mclaughlin [id number] Date: 05/09/2021 Reason for consult: Initial assessment;Primapara;1st time breastfeeding;Late-preterm 34-36.6wks;Difficult latch;Breastfeeding assistance;Other (Comment) - Latch score - 5 - baby sleepy - STS  Age:21 years P 1  Challenging latch due to areola edema.  Mom already had a hand pump set up and had been instructed.  LC reviewed breast feeding basics and added to the plan prior to latch Breast massage, hand express, pre - pump and reverse pressure, firm support.  When Latching STS , and work with baby to open wide prior to latch.  After baby feeds - post pump both breast 15 mins and save the EBM for the next feeding.  LC discussed feeding goals for 24 hours - feed with cues and by 3 hours STS.  If latching remains difficult - Supplementing with EBM , Donor milk ( by consent , or formula recommended . ( Mom aware  and receptive )  Report given to [facility]     Maternal Data - per mom + breast changes with pregnancy  Has patient been taught Hand Expression?: Yes (tiny drops) Does the patient have breastfeeding experience prior to this delivery?: No  Feeding Mother's Current Feeding Choice: Breast Milk  LATCH Score Latch: Too sleepy or reluctant, no latch achieved, no sucking elicited.  Audible Swallowing: None  Type of Nipple: Everted at rest and after stimulation (areola edema - semi compressible - will require reverse pressure , shells between feedings except when sleeping and when baby is STS, and pre - pumping)  Comfort (Breast/Nipple): Soft / non-tender  Hold (Positioning): Assistance needed to correctly position infant at breast and maintain latch.  LATCH Score: 5   Lactation Tools Discussed/Used Manual and DEBP , shells     Interventions Interventions: Breast feeding basics reviewed;Assisted with latch;Breast massage;Skin to skin;Hand  express;Pre-pump if needed;Reverse pressure;Adjust position;Support pillows;Position options;Shells;Hand pump;DEBP;Education;LC Services brochure  Discharge Pump: Personal;DEBP (per mom DEBP Medela)  Consult Status Consult Status: Follow-up Date: 05/09/21 Follow-up type: In-patient    Matilde Sprang Eashan Schipani 05/09/2021, 9:07 AM

## 2021-05-09 NOTE — Discharge Summary (Signed)
Postpartum Discharge Summary     Patient Name: Bianca Mclaughlin DOB: [birth year] MRN: [id number]  Date of admission: 05/08/2021 Delivery date:05/08/2021  Delivering provider: Christin Fudge  Date of discharge: 05/10/2021  Admitting diagnosis: Hypertension in pregnancy, antepartum [O16.9] Intrauterine pregnancy: [redacted]w[redacted]d    Secondary diagnosis:  Principal Problem:   Vaginal delivery Active Problems:   Supervision of high risk pregnancy, antepartum   Carrier of spinal muscular atrophy   Positive GBS test   Hypertension in pregnancy, antepartum  Additional problems: None    Discharge diagnosis: Preterm Pregnancy Delivered                                              Post partum procedures: None Augmentation: Cytotec Complications: None  Hospital course: Onset of Labor With Vaginal Delivery      21y.o. yo G1P0000 at 317w0das admitted in Latent Labor on 05/08/2021. Patient had an uncomplicated labor course as follows:  Membrane Rupture Time/Date: 4:00 PM ,05/08/2021   Delivery Method:Vaginal, Spontaneous  Episiotomy: None  Lacerations:  1st degree;Perineal  Patient had an uncomplicated postpartum course.  She is ambulating, tolerating a regular diet, passing flatus, and urinating well.  She was started on Procardia and Lasix postpartum for BP control with good result.  She will continue these upon discharge and will have a BP check in one week.  Patient is discharged home in stable condition on 05/10/21.  Newborn Data: Birth date:05/08/2021  Birth time:11:34 PM  Gender:Female  Living status:Living  Apgars:9 ,9  Weight:2960 g   Magnesium Sulfate received: No BMZ received: No Rhophylac: N/A MMR: N/A T-DaP: Offered postpartum  Flu: Yes Transfusion: No  Physical exam  Vitals:   05/09/21 1000 05/09/21 1407 05/09/21 2119 05/10/21 0545  BP: 125/72 129/81 129/81 130/80  Pulse: 75 (!) 102 91 93  Resp:  18    Temp:  98.3 F (36.8 C)    TempSrc:  Oral    SpO2:  100%     Weight:      Height:       General: alert, cooperative, and no distress Lochia: appropriate Uterine Fundus: firm and below umbilicus  DVT Evaluation: no LE edema or calf tenderness to palpation   Labs: Lab Results  Component Value Date   WBC 5.2 05/08/2021   HGB 11.6 (L) 05/08/2021   HCT 34.5 (L) 05/08/2021   MCV 82.7 05/08/2021   PLT 214 05/08/2021   CMP Latest Ref Rng & Units 05/07/2021  Glucose 70 - 99 mg/dL 102(H)  BUN 6 - 20 mg/dL 5(L)  Creatinine 0.44 - 1.00 mg/dL 0.56  Sodium 135 - 145 mmol/L 135  Potassium 3.5 - 5.1 mmol/L 3.7  Chloride 98 - 111 mmol/L 107  CO2 22 - 32 mmol/L 18(L)  Calcium 8.9 - 10.3 mg/dL 8.9  Total Protein 6.5 - 8.1 g/dL 6.4(L)  Total Bilirubin 0.3 - 1.2 mg/dL 0.6  Alkaline Phos 38 - 126 U/L 100  AST 15 - 41 U/L 28  ALT 0 - 44 U/L 17   Edinburgh Score: Edinburgh Postnatal Depression Scale Screening Tool 05/09/2021  I have been able to laugh and see the funny side of things. (No Data)     After visit meds:  Allergies as of 05/10/2021       Reactions   Other Hives, Itching, Swelling, Rash   Cat dander  Macadamia Nut Oil Hives   Tree Extract Hives        Medication List     STOP taking these medications    aspirin 81 MG chewable tablet   promethazine 25 MG tablet Commonly known as: PHENERGAN   triamcinolone ointment 0.5 % Commonly known as: KENALOG   VITAMIN B-6 PO       TAKE these medications    furosemide 20 MG tablet Commonly known as: LASIX Take 1 tablet (20 mg total) by mouth 2 (two) times daily.   ibuprofen 600 MG tablet Commonly known as: ADVIL Take 1 tablet (600 mg total) by mouth every 6 (six) hours.   NIFEdipine 30 MG 24 hr tablet Commonly known as: ADALAT CC Take 1 tablet (30 mg total) by mouth daily.   norethindrone 0.35 MG tablet Commonly known as: MICRONOR Take 1 tablet (0.35 mg total) by mouth daily.   PRENATAL VITAMIN PO Take by mouth.         Discharge home in stable  condition Infant Feeding: Breast Infant Disposition: home with mother Discharge instruction: per After Visit Summary and Postpartum booklet. Activity: Advance as tolerated. Pelvic rest for 6 weeks.  Diet: routine diet Future Appointments: Future Appointments  Date Time Provider [place]  05/16/2021 11:10 AM Roma Schanz, CNM [facility]   Follow up Visit: Please schedule this patient for a In person postpartum visit in 4 weeks with the following provider: Any provider. Additional Postpartum F/U: BP check 1 week  Low risk pregnancy complicated by: HTN Delivery mode:  Vaginal, Spontaneous  Anticipated Birth Control:  POPs  05/10/2021 Genia Del, MD

## 2021-05-10 ENCOUNTER — Encounter (HOSPITAL_COMMUNITY): Payer: Self-pay | Admitting: Family Medicine

## 2021-05-10 ENCOUNTER — Inpatient Hospital Stay (HOSPITAL_COMMUNITY)
Admission: AD | Admit: 2021-05-10 | Payer: Managed Care, Other (non HMO) | Source: Home / Self Care | Admitting: Obstetrics and Gynecology

## 2021-05-10 ENCOUNTER — Inpatient Hospital Stay (HOSPITAL_COMMUNITY): Payer: Managed Care, Other (non HMO)

## 2021-05-10 DIAGNOSIS — O0990 Supervision of high risk pregnancy, unspecified, unspecified trimester: Secondary | ICD-10-CM

## 2021-05-10 DIAGNOSIS — I10 Essential (primary) hypertension: Secondary | ICD-10-CM

## 2021-05-10 MED ORDER — NORETHINDRONE 0.35 MG PO TABS: 1.0000 | ORAL_TABLET | Freq: Every day | ORAL | 11 refills | Status: DC

## 2021-05-10 NOTE — Lactation Note (Signed)
This note was copied from a baby's chart. Lactation Consultation Note  Patient Name: Bianca Mclaughlin LDJTT'S Date: 05/10/2021 Reason for consult: Follow-up assessment;1st time breastfeeding;Late-preterm 34-36.6wks Age:23 hours  P1, PMA [redacted]w[redacted]d.  Mother recently breastfed baby. Reviewed supplementing and pumping frequency with mother. Feed on demand with cues.  Goal 8-12+ times per day after first 24 hrs.  Place baby STS if not cueing.  Observed latch.  Provided pillows for support.   Feeding Mother's Current Feeding Choice: Breast Milk and Donor Milk  LATCH Score Latch: Grasps breast easily, tongue down, lips flanged, rhythmical sucking.  Audible Swallowing: A few with stimulation  Type of Nipple: Everted at rest and after stimulation  Comfort (Breast/Nipple): Soft / non-tender  Hold (Positioning): Assistance needed to correctly position infant at breast and maintain latch.  LATCH Score: 8   Lactation Tools Discussed/Used  DEBP  Interventions Interventions: Breast feeding basics reviewed;Education  Discharge Pump: Personal;DEBP  Consult Status Consult Status: Follow-up Date: 05/11/21 Follow-up type: In-patient    Dahlia Byes [facility] 05/10/2021, 12:14 PM

## 2021-05-10 NOTE — Progress Notes (Signed)
Mom states she has B/P machine that family will bring to hospital tonight. Medications for home use were delivered to bedside, verbalizes understanding . Patient states she has baby scripts app on phone and am B/P received . Understands need to monitor while on medications and warning signs of high and low B/P . Baby patient reviewed

## 2021-05-10 NOTE — Progress Notes (Signed)
Mom discharged to room

## 2021-05-11 ENCOUNTER — Ambulatory Visit: Payer: Self-pay

## 2021-05-11 NOTE — Lactation Note (Signed)
This note was copied from a baby's chart. Lactation Consultation Note  Patient Name: Girl Eli Adami ZDGLO'V Date: 05/11/2021 Reason for consult: Follow-up assessment;1st time breastfeeding;Primapara;Late-preterm 34-36.6wks Age:23 hours  LC in to visit with P1 Mom of AGA [redacted]w[redacted]d baby on day of discharge.  Baby at 5.9% weight loss with good output and good bilirubin level.  LC assisted Mom to latch baby in cross cradle hold.  Assisted Mom to support her breast in a U hold away from areola.  Reviewed hand expression and transitional milk spray across the bed.  Breasts are filling, not engorged.  Baby initially latched too shallow and baby popped off.  Reviewed importance of waiting for a wide open mouth before latching baby with lower lip down on areola away from nipple.  Assisted in folding baby's mouth over breast.  Baby latched deeply and her body relaxed as she started consistently sucking and swallowing well.    Ped recommended supplementing baby with EBM+/donor milk 20-30 ml after breastfeeding, but baby was spitting.  Mom is supplementing with 10-15 ml.  Mom to pump after breastfeeding at least every other time, and feed baby back her EBM by paced bottle (reviewed this with Mom)  Encouraged STS with baby, and offering the breast with a deep latch when baby cues.  Engorgement prevention and treatment reviewed. Mom recommended to F/U with OP lactation appointment, message sent to clinic.  Mom aware of OP lactation support and encouraged to call prn.  LATCH Score Latch: Grasps breast easily, tongue down, lips flanged, rhythmical sucking.  Audible Swallowing: Spontaneous and intermittent  Type of Nipple: Everted at rest and after stimulation  Comfort (Breast/Nipple): Soft / non-tender (filling)  Hold (Positioning): Assistance needed to correctly position infant at breast and maintain latch.  LATCH Score: 9   Lactation Tools Discussed/Used Tools: Pump;Bottle Breast pump type:  Double-Electric Breast Pump Pump Education: Setup, frequency, and cleaning Pumping frequency: Encouragd to pump after every other breastfeeding or 4-6 times per 24 hrs. Pumped volume: 10 mL  Interventions Interventions: Breast feeding basics reviewed;Assisted with latch;Skin to skin;Breast massage;Hand express;Breast compression;Adjust position;Support pillows;Position options;Expressed milk;DEBP;Education;Pace feeding  Discharge Discharge Education: Engorgement and breast care;Warning signs for feeding baby;Outpatient recommendation;Outpatient Epic message sent Pump: Personal (Medela DEBP)  Consult Status Consult Status: Complete Date: 05/11/21 Follow-up type: Out-patient    Judee Clara 05/11/2021, 2:34 PM

## 2021-05-15 ENCOUNTER — Encounter: Payer: Managed Care, Other (non HMO) | Admitting: Women's Health

## 2021-05-15 ENCOUNTER — Other Ambulatory Visit: Payer: Managed Care, Other (non HMO)

## 2021-05-15 ENCOUNTER — Encounter: Payer: Self-pay | Admitting: *Deleted

## 2021-05-16 ENCOUNTER — Encounter: Payer: Self-pay | Admitting: Women's Health

## 2021-05-16 ENCOUNTER — Other Ambulatory Visit: Payer: Self-pay

## 2021-05-16 ENCOUNTER — Ambulatory Visit (INDEPENDENT_AMBULATORY_CARE_PROVIDER_SITE_OTHER): Payer: Managed Care, Other (non HMO) | Admitting: Women's Health

## 2021-05-16 VITALS — BP 122/79 | HR 92 | Ht 64.0 in | Wt 171.0 lb

## 2021-05-16 DIAGNOSIS — I10 Essential (primary) hypertension: Secondary | ICD-10-CM

## 2021-05-16 NOTE — Patient Instructions (Addendum)
Stop bp med 2 days before postpartum visit  Tips To Increase Milk Supply Lots of water! Enough so that your urine is clear Plenty of calories, if you're not getting enough calories, your milk supply can decrease Breastfeed/pump often, every 2-3 hours x 20-48mins Fenugreek 3 pills 3 times a day, this may make your urine smell like maple syrup Mother's Milk Tea Lactation cookies, google for the recipe Real oatmeal Body Armor sports drinks Greater Than hydration drink

## 2021-05-16 NOTE — Progress Notes (Signed)
° °  GYN VISIT Patient name: Bianca Mclaughlin MRN [id number]  Date of birth: May 04, 2000 Chief Complaint:   Blood Pressure Check  History of Present Illness:   Bianca Mclaughlin is a 21 y.o. G87P0101 African-American female 7d s/p SVB being seen today for bp check. Dx w/ CHTN during pregnancy, no meds prior to pregnancy. On nifedipine 30mg  daily right now.  Breast and bottle feeding.  No LMP recorded. (Menstrual status: Lactating). Depression screen [facility] 2/9 03/12/2021 11/20/2020 10/01/2020 09/12/2020 05/10/2020  Decreased Interest 0 0 0 0 0  Down, Depressed, Hopeless 0 0 0 0 0  PHQ - 2 Score 0 0 0 0 0  Altered sleeping 0 1 1 - -  Tired, decreased energy 0 1 1 - -  Change in appetite 0 0 1 - -  Feeling bad or failure about yourself  0 0 0 - -  Trouble concentrating 0 1 0 - -  Moving slowly or fidgety/restless 0 0 0 - -  Suicidal thoughts 0 0 0 - -  PHQ-9 Score 0 3 3 - -  Difficult doing work/chores - - - - -     GAD 7 : Generalized Anxiety Score 03/12/2021 11/20/2020 10/01/2020  Nervous, Anxious, on Edge 0 0 0  Control/stop worrying 0 0 0  Worry too much - different things 0 1 0  Trouble relaxing 0 0 0  Restless 0 0 0  Easily annoyed or irritable 0 1 1  Afraid - awful might happen 0 0 0  Total GAD 7 Score 0 2 1     Review of Systems:   Pertinent items are noted in HPI Denies fever/chills, dizziness, headaches, visual disturbances, fatigue, shortness of breath, chest pain, abdominal pain, vomiting, abnormal vaginal discharge/itching/odor/irritation, problems with periods, bowel movements, urination, or intercourse unless otherwise stated above.  Pertinent History Reviewed:  Reviewed past medical,surgical, social, obstetrical and family history.  Reviewed problem list, medications and allergies. Physical Assessment:   Vitals:   05/16/21 1122  BP: 122/79  Pulse: 92  Weight: 171 lb (77.6 kg)  Height: 5\' 4"  (1.626 m)  Body mass index is 29.35 kg/m.       Physical Examination:   General  appearance: alert, well appearing, and in no distress  Mental status: alert, oriented to person, place, and time  Skin: warm & dry   Cardiovascular: normal heart rate noted  Respiratory: normal respiratory effort, no distress  Abdomen: soft, non-tender   Pelvic: examination not indicated  Extremities: no edema   Chaperone: N/A    No results found for this or any previous visit (from the past 24 hour(s)).  Assessment & Plan:  1) 7d s/p SVB> breast & bottlefeeding  2) BP check/CHTN> no meds prior to pregnancy, on nifedipine 30mg  daily now, continue until 2d prior to pp visit  Meds: No orders of the defined types were placed in this encounter.   No orders of the defined types were placed in this encounter.   Return for As scheduled.  05/18/21 CNM, [facility] 05/16/2021 11:42 AM

## 2021-05-19 ENCOUNTER — Telehealth (HOSPITAL_COMMUNITY): Payer: Self-pay | Admitting: *Deleted

## 2021-05-19 NOTE — Telephone Encounter (Signed)
Phone voicemail message left to return nurse call.  Duffy Rhody, RN 05-19-2021 at 10:52am

## 2021-06-12 ENCOUNTER — Encounter: Payer: Self-pay | Admitting: Women's Health

## 2021-06-12 ENCOUNTER — Other Ambulatory Visit: Payer: Self-pay

## 2021-06-12 ENCOUNTER — Ambulatory Visit (INDEPENDENT_AMBULATORY_CARE_PROVIDER_SITE_OTHER): Payer: Managed Care, Other (non HMO) | Admitting: Women's Health

## 2021-06-12 NOTE — Progress Notes (Signed)
? ?POSTPARTUM VISIT ?Patient name: Bianca Mclaughlin MRN [id number]  Date of birth: Oct 21, 2000 ?Chief Complaint:   ?Postpartum Care ? ?History of Present Illness:   ?Bianca Mclaughlin is a 21 y.o. G52P0101 African American female being seen today for a postpartum visit. She is 4 weeks postpartum following a spontaneous vaginal delivery at 37.0 gestational weeks. IOL: yes, for PPROM @ 36.6wk, delivered after midnight, so was 37.0wks at delivery. Anesthesia: epidural.  Laceration: 1st degree.  Complications: none. Inpatient contraception: no.   ?Pregnancy complicated by [facility] dx by elevated bp @ 16 & 20wks . ?Tobacco use: no. Substance use disorder: no. ?Last pap smear: 11/20/20 and results were NILM w/ HRHPV not done. Next pap smear due: 2025 ?No LMP recorded (lmp unknown). (Menstrual status: Lactating). ? ?Postpartum course has been complicated by HTN, d/c'd on procardia 75m daily . Stopped few days ago as directed. Home bp's have been normal, 120s/70s.  Bleeding none. Bowel function is normal. Bladder function is normal. Urinary incontinence? no, fecal incontinence? no ?Patient is not sexually active. Last sexual activity: prior to birth of baby. Desired contraception:  on POPs . Patient does want a pregnancy in the future.  Desired family size is 2-3 children.  ? Upstream - 06/12/21 1349   ? ?  ? Pregnancy Intention Screening  ? Does the patient want to become pregnant in the next year? No   ? Does the patient's partner want to become pregnant in the next year? No   ? Would the patient like to discuss contraceptive options today? No   ?  ? Contraception Wrap Up  ? Current Method Oral Contraceptive   ? End Method Oral Contraceptive   ? Contraception Counseling Provided No   ? ?  ?  ? ?  ? ?The pregnancy intention screening data noted above was reviewed. Potential methods of contraception were discussed. The patient elected to proceed with Oral Contraceptive. ? ?Edinburgh Postpartum Depression Screening: negative ?  Edinburgh Postnatal Depression Scale - 06/12/21 1349   ? ?  ? Edinburgh Postnatal Depression Scale:  In the Past 7 Days  ? I have been able to laugh and see the funny side of things. 0   ? I have looked forward with enjoyment to things. 0   ? I have blamed myself unnecessarily when things went wrong. 1   ? I have been anxious or worried for no good reason. 1   ? I have felt scared or panicky for no good reason. 1   ? Things have been getting on top of me. 1   ? I have been so unhappy that I have had difficulty sleeping. 0   ? I have felt sad or miserable. 0   ? I have been so unhappy that I have been crying. 0   ? The thought of harming myself has occurred to me. 0   ? Edinburgh Postnatal Depression Scale Total 4   ? ?  ?  ? ?  ?  ?GAD 7 : Generalized Anxiety Score 03/12/2021 11/20/2020 10/01/2020  ?Nervous, Anxious, on Edge 0 0 0  ?Control/stop worrying 0 0 0  ?Worry too much - different things 0 1 0  ?Trouble relaxing 0 0 0  ?Restless 0 0 0  ?Easily annoyed or irritable 0 1 1  ?Afraid - awful might happen 0 0 0  ?Total GAD 7 Score 0 2 1  ? ? ? ?Baby's course has been uncomplicated, other than tongue-tie.  Baby is feeding by breast  and bottle: milk supply inadequate . Infant has a pediatrician/family doctor? Yes.  Childcare strategy if returning to work/school: family.  Pt has material needs met for her and baby: Yes.   ?Review of Systems:   ?Pertinent items are noted in HPI ?Denies Abnormal vaginal discharge w/ itching/odor/irritation, headaches, visual changes, shortness of breath, chest pain, abdominal pain, severe nausea/vomiting, or problems with urination or bowel movements. ?Pertinent History Reviewed:  ?Reviewed past medical,surgical, obstetrical and family history.  ?Reviewed problem list, medications and allergies. ?OB History  ?Gravida Para Term Preterm AB Living  ?1 1 0 1 0 1  ?SAB IAB Ectopic Multiple Live Births  ?0 0 0 0 1  ?  ?# Outcome Date GA Lbr Len/2nd Weight Sex Delivery Anes PTL Lv  ?1 Preterm  05/08/21 [redacted]w[redacted]d/ 00:46 6 lb 8.4 oz (2.96 kg) F Vag-Spont EPI  LIV  ? ?Physical Assessment:  ? ?Vitals:  ? 06/12/21 1345 06/12/21 1348  ?BP: 139/90 134/83  ?Pulse: 89 84  ?Weight: 174 lb (78.9 kg)   ?Height: 5' 4" (1.626 m)   ?Body mass index is 29.87 kg/m?. ? ?     Physical Examination:  ? General appearance: alert, well appearing, and in no distress ? Mental status: alert, oriented to person, place, and time ? Skin: warm & dry  ? Cardiovascular: normal heart rate noted  ? Respiratory: normal respiratory effort, no distress  ? Breasts: deferred, no complaints  ? Abdomen: soft, non-tender  ? Pelvic: examination not indicated. Thin prep pap obtained: No ? Rectal: not examined ? Extremities: Edema: none  ? ?Chaperone: N/A   ?      ?No results found for this or any previous visit (from the past 24 hour(s)).  ?Assessment & Plan:  ?1) Postpartum exam ?2) 4 wks s/p spontaneous vaginal delivery at 37.0 (chart says 36.6, but per notes was after midnight) d/t PPROM ?3) breast & bottle feeding ?4) Depression screening ?5) Contraception continue POPs ?6) CHTN dx during pregnancy> stopped procardia 3257mfew days ago, home bp's 120s/70s. To return Mon for bp check w/ nurse, bring her cuff too. Continue checking daily at home, if >140/90, resume procardia ? ?Essential components of care per ACOG recommendations: ? ?1.  Mood and well being:  ?If positive depression screen, discussed and plan developed.  ?If using tobacco we discussed reduction/cessation and risk of relapse ?If current substance abuse, we discussed and referral to local resources was offered.  ? ?2. Infant care and feeding:  ?If breastfeeding, discussed returning to work, pumping, breastfeeding-associated pain, guidance regarding return to fertility while lactating if not using another method. If needed, patient was provided with a letter to be allowed to pump q 2-3hrs to support lactation in a private location with access to a refrigerator to store breastmilk.    ?Recommended that all caregivers be immunized for flu, pertussis and other preventable communicable diseases ?If pt does not have material needs met for her/baby, referred to local resources for help obtaining these. ? ?3. Sexuality, contraception and birth spacing ?Provided guidance regarding sexuality, management of dyspareunia, and resumption of intercourse ?Discussed avoiding interpregnancy interval <57m25m and recommended birth spacing of 18 months ? ?4. Sleep and fatigue ?Discussed coping options for fatigue and sleep disruption ?Encouraged family/partner/community support of 4 hrs of uninterrupted sleep to help with mood and fatigue ? ?5. Physical recovery  ?If pt had a C/S, assessed incisional pain and providing guidance on normal vs prolonged recovery ?If pt had a laceration, perineal healing  and pain reviewed.  ?If urinary or fecal incontinence, discussed management and referred to PT or uro/gyn if indicated  ?Patient is safe to resume physical activity. Discussed attainment of healthy weight. ? ?6.  Chronic disease management ?Discussed pregnancy complications if any, and their implications for future childbearing and long-term maternal health. ?Review recommendations for prevention of recurrent pregnancy complications, such as 17 hydroxyprogesterone caproate to reduce risk for recurrent PTB not applicable, or aspirin to reduce risk of preeclampsia yes. ?Pt had GDM: no. If yes, 2hr GTT scheduled: not applicable. ?Reviewed medications and non-pregnant dosing including consideration of whether pt is breastfeeding using a reliable resource such as LactMed: not applicable ?Referred for f/u w/ PCP or subspecialist providers as indicated: not applicable ? ?7. Health maintenance ?Mammogram at 23yo or earlier if indicated ?Pap smears as indicated ? ?Meds: No orders of the defined types were placed in this encounter. ? ? ?Follow-up: Return for monday bp check w/ nurse.  ? ?No orders of the defined types were  placed in this encounter. ? ? ?[place], WHNP-BC ?06/12/2021 ?2:13 PM ?  ?

## 2021-06-16 ENCOUNTER — Ambulatory Visit (INDEPENDENT_AMBULATORY_CARE_PROVIDER_SITE_OTHER): Payer: Managed Care, Other (non HMO) | Admitting: *Deleted

## 2021-06-16 ENCOUNTER — Other Ambulatory Visit: Payer: Self-pay | Admitting: Women's Health

## 2021-06-16 ENCOUNTER — Other Ambulatory Visit: Payer: Self-pay

## 2021-06-16 ENCOUNTER — Encounter: Payer: Self-pay | Admitting: *Deleted

## 2021-06-16 VITALS — BP 139/87 | HR 89 | Ht 64.0 in | Wt 169.5 lb

## 2021-06-16 DIAGNOSIS — Z0130 Encounter for examination of blood pressure without abnormal findings: Secondary | ICD-10-CM

## 2021-06-16 MED ORDER — AMLODIPINE BESYLATE 5 MG PO TABS: 5.0000 mg | ORAL_TABLET | Freq: Every day | ORAL | 2 refills | Status: DC

## 2021-06-16 NOTE — Progress Notes (Signed)
? ?  NURSE VISIT- BLOOD PRESSURE CHECK ? ?SUBJECTIVE:  ?Bianca Mclaughlin is a 21 y.o. G56P0101 female here for BP check. She is postpartum, delivery date 05/08/21    ? ?HYPERTENSION ROS: ? ?Pregnant/postpartum:  ?Severe headaches that don't go away with tylenol/other medicines: No  ?Visual changes (seeing spots/double/blurred vision) No  ?Severe pain under right breast breast or in center of upper chest No  ?Severe nausea/vomiting No  ?Taking medicines as instructed not applicable ? ? ? ?OBJECTIVE:  ?BP 139/87 (BP Location: Left Arm, Patient Position: Sitting, Cuff Size: Normal)   Pulse 89   Ht 5\' 4"  (1.626 m)   Wt 169 lb 8 oz (76.9 kg)   LMP  (LMP Unknown)   Breastfeeding Yes   BMI 29.09 kg/m?  BP with pt's cuff was 144/94 pulse 84. ?Appearance alert, well appearing, and in no distress. ? ?ASSESSMENT: ?Postpartum  blood pressure check ? ?PLAN: ?Discussed with , CNM, WHNP   ?Recommendations: new prescription will be sent   ?Follow-up:  with PCP in a few weeks.    ? ?Joellyn Haff  ?06/16/2021 ?9:47 AM ? ?

## 2022-02-24 ENCOUNTER — Encounter: Payer: Managed Care, Other (non HMO) | Admitting: Internal Medicine

## 2022-03-11 ENCOUNTER — Encounter: Payer: Managed Care, Other (non HMO) | Admitting: Family Medicine

## 2022-04-14 ENCOUNTER — Ambulatory Visit (INDEPENDENT_AMBULATORY_CARE_PROVIDER_SITE_OTHER): Payer: Managed Care, Other (non HMO) | Admitting: Family Medicine

## 2022-04-14 ENCOUNTER — Encounter: Payer: Self-pay | Admitting: Family Medicine

## 2022-04-14 VITALS — BP 151/84 | HR 98 | Ht 64.0 in | Wt 184.0 lb

## 2022-04-14 DIAGNOSIS — Z30011 Encounter for initial prescription of contraceptive pills: Secondary | ICD-10-CM | POA: Diagnosis not present

## 2022-04-14 DIAGNOSIS — J351 Hypertrophy of tonsils: Secondary | ICD-10-CM | POA: Diagnosis not present

## 2022-04-14 DIAGNOSIS — Z118 Encounter for screening for other infectious and parasitic diseases: Secondary | ICD-10-CM

## 2022-04-14 DIAGNOSIS — Z0001 Encounter for general adult medical examination with abnormal findings: Secondary | ICD-10-CM | POA: Insufficient documentation

## 2022-04-14 DIAGNOSIS — Z23 Encounter for immunization: Secondary | ICD-10-CM | POA: Diagnosis not present

## 2022-04-14 LAB — POCT URINE PREGNANCY: Preg Test, Ur: NEGATIVE

## 2022-04-14 MED ORDER — LEVONORGESTREL-ETHINYL ESTRAD 0.1-20 MG-MCG PO TABS: 1.0000 | ORAL_TABLET | Freq: Every day | ORAL | 0 refills | Status: DC

## 2022-04-14 NOTE — Assessment & Plan Note (Signed)
Physical exam as documented Counseling is done on healthy lifestyle involving commitment to 150 minutes of exercise per week,  Discussed heart-healthy diet  Reviewed importance of engaging in physical activities daily Physical activity helps: Lower your blood glucose, improve your heart health, lower your blood pressure and cholesterol, burn calories to help manage her weight, gave you energy, lower stress, and improve his sleep.  The American diabetes Association (ADA) recommends being active for 2-1/2 hours (150 minutes) or more week.  Exercise for 30 minutes, 5 days a week (150 minutes total)

## 2022-04-14 NOTE — Assessment & Plan Note (Addendum)
Negative pregnancy test She complains of dysmenorrhea and heavy menstrual  She would like to be started on oral contraceptive today Denies history of DVT, stroke, and MI Will start patient on combined oral contraceptive today Education as follow: Warning signs (ACHES) Abdominal pain (severe) Chest pain (sharp, severe, shortness of breath) Headache (severe, dizziness, unilateral) Eye problems (scotoma, blurred vision, blind spots) Severe leg pain (calf or thigh)  Instructions for use Quick start method-reasonably certain not pregnant, take first pill on day of office visit; backup method for 7 days if more than 5 days since last menstrual period First day start-take first pill on first day of menses; no backup method needed Take pill at approximately same time each day If nausea occurs, take pill with meals or at bedtime Use backup method (condoms) if efficacy/absorbency compromised by severe vomiting and or diarrhea Use condoms for prevention of STIs and HIV  Common side effects/adverse effects Nausea, increased breast size/breast tenderness, increased blood pressure, venous thromboembolism(VTE), fatigue, depressive symptoms, cyclic weight gain

## 2022-04-14 NOTE — Progress Notes (Signed)
Complete physical exam  Patient: Bianca Mclaughlin   DOB: Nov 01, 2000   21 y.o. Female  MRN: [id number]  Subjective:    Chief Complaint  Patient presents with   Annual Exam    Cpe today. Pt reports thumb pain on her left hand, onset of this began 6 months ago ( 08/12/2021), has concerns about her tonsils.    Contraception    Would like to discuss contraception needs to switch from non hormonal to hormonal.     Bianca Mclaughlin is a 22 y.o. female who presents today for a complete physical exam. She reports consuming a general diet. The patient does not participate in regular exercise at present. She generally feels well. She reports sleeping well. She does have additional problems to discuss today.       Most recent fall risk assessment:    04/14/2022    3:06 PM  [place] in the past year? 0  Number falls in past yr: 0  Injury with Fall? 0  Risk for fall due to : No Fall Risks  Follow up Falls evaluation completed     Most recent depression screenings:    04/14/2022    3:06 PM 03/12/2021    9:44 AM  PHQ 2/9 Scores  PHQ - 2 Score 0 0  PHQ- 9 Score 2 0    Dental: No current dental problems and No regular dental care   Patient Active Problem List   Diagnosis Date Noted   Oral contraception initiation 04/14/2022   Encounter for annual general medical examination with abnormal findings in adult 04/14/2022   Enlarged tonsils 04/14/2022   Hypertension in pregnancy, antepartum 05/08/2021   Carrier of spinal muscular atrophy 12/03/2020   Mixed hyperlipidemia 09/12/2020   Vitamin D deficiency 09/12/2020   Environmental allergies 02/07/2020   Eczema 02/07/2020   Family history of breast cancer in mother 04/15/2017   Mild persistent asthma 02/20/2015   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 Mom was 47   High cholesterol    Hypertension    Pregnancy induced hypertension    Seasonal allergies    Urticaria    Past Surgical History:  Procedure Laterality Date   NO PAST SURGERIES     Social History   Tobacco Use   Smoking status: Never   Smokeless tobacco: Never  Vaping Use   Vaping Use: Former   Substances: Nicotine, Flavoring  Substance Use Topics   Alcohol use: Not Currently    Comment: Occasionally   Drug use: No   Social History   Socioeconomic History   Marital status: Married    Spouse name: Not on file   Number of children: Not on file   Years of education: Not on file   Highest education level: Not on file  Occupational History    Comment: [employer]  Tobacco Use   Smoking status: Never   Smokeless tobacco: Never  Vaping Use   Vaping Use: Former   Substances: Nicotine, Flavoring  Substance and Sexual Activity   Alcohol use: Not Currently    Comment: Occasionally   Drug use: No   Sexual activity: Yes    Birth control/protection: Pill  Other Topics Concern   Not on  file  Social History Narrative   Not on file   Social Determinants of Health   Financial Resource Strain: Low Risk  (06/12/2021)   Overall Financial Resource Strain (CARDIA)    Difficulty of Paying Living Expenses: Not hard at all  Food Insecurity: No Food Insecurity (06/12/2021)   Hunger Vital Sign    Worried About Running Out of Food in the Last Year: Never true    Ran Out of Food in the Last Year: Never true  Transportation Needs: No Transportation Needs (06/12/2021)   PRAPARE - Hydrologist (Medical): No    Lack of Transportation (Non-Medical): No  Physical Activity: Inactive (06/12/2021)   Exercise Vital Sign    Days of Exercise per Week: 0 days    Minutes of Exercise per Session: 0 min  Stress: No Stress Concern Present (06/12/2021)   [place]    Feeling of Stress : Only a little  Social Connections: Moderately Integrated (06/12/2021)   Social Connection and Isolation Panel [NHANES]    Frequency of Communication with Friends and Family: More than three times a week    Frequency of Social Gatherings with Friends and Family: Three times a week    Attends Religious Services: 1 to 4 times per year    Active Member of Clubs or Organizations: No    Attends Archivist Meetings: Never    Marital Status: Married  Human resources officer Violence: Not At Risk (06/12/2021)   Humiliation, Afraid, Rape, and Kick questionnaire    Fear of Current or Ex-Partner: No    Emotionally Abused: No    Physically Abused: No    Sexually Abused: No   Family Status  Relation Name Status   PGF  Deceased   PGM  Alive   MGM  Alive   MGF  Deceased   Father  Alive   Mother  Alive   Brother step Alive   Brother half [place]  (Not Specified)   Field seismologist  (Not Specified)   Daughter  Alive   Neg Hx  (Not Specified)   Family History  Problem Relation Age of Onset   Allergic rhinitis Father    Eczema Father    Urticaria Father    Hypertension Father    Hypertension Mother    Breast cancer Mother 40   Eczema Sister    Breast cancer Maternal Aunt 25   Hypertension Maternal Aunt    Breast cancer Paternal Aunt 34   Asthma Neg Hx    Immunodeficiency Neg Hx    Allergies  Allergen Reactions   Other Hives, Itching, Swelling and Rash    Cat dander   Macadamia Nut Oil Hives   Tree Extract Hives      Patient Care Team: Lindell Spar, MD as PCP - General (Internal Medicine)   Outpatient Medications Prior to Visit  Medication Sig   [DISCONTINUED] norethindrone (MICRONOR) 0.35 MG tablet Take 1 tablet (0.35 mg total) by mouth daily.   amLODipine (NORVASC) 5 MG tablet Take 1 tablet (5 mg total) by mouth daily. (Patient not taking: Reported on 04/14/2022)   Prenatal Vit-Fe Fumarate-FA  (PRENATAL VITAMIN PO) Take by mouth. (Patient not taking: Reported on 04/14/2022)   No facility-administered medications prior to visit.    Review of Systems  Constitutional:  Negative for chills, fever and malaise/fatigue.  HENT:  Negative for congestion and sinus  pain.   Eyes:  Negative for pain, discharge and redness.  Respiratory:  Negative for cough, sputum production and shortness of breath.   Cardiovascular:  Negative for chest pain, palpitations, claudication and leg swelling.  Gastrointestinal:  Negative for diarrhea, heartburn and nausea.  Genitourinary:  Negative for flank pain and frequency.  Musculoskeletal:  Negative for back pain and joint pain.  Skin:  Negative for itching.  Neurological:  Negative for dizziness, seizures and headaches.  Endo/Heme/Allergies:  Negative for environmental allergies.  Psychiatric/Behavioral:  Negative for memory loss. The patient does not have insomnia.        Objective:    BP (!) 151/84   Pulse 98   Ht 5\' 4"  (1.626 m)   Wt 184 lb 0.6 oz (83.5 kg)   SpO2 97%   Breastfeeding No   BMI 31.59 kg/m  BP Readings from Last 3 Encounters:  04/14/22 (!) 151/84  06/16/21 139/87  06/12/21 134/83   Wt Readings from Last 3 Encounters:  04/14/22 184 lb 0.6 oz (83.5 kg)  06/16/21 169 lb 8 oz (76.9 kg)  06/12/21 174 lb (78.9 kg)      Physical Exam HENT:     Head: Normocephalic.     Left Ear: External ear normal.     Mouth/Throat:     Mouth: Mucous membranes are moist.     Dentition: Normal dentition. Does not have dentures.     Tongue: No lesions. Tongue does not deviate from midline.     Palate: No mass and lesions.     Pharynx: Uvula midline. No oropharyngeal exudate or uvula swelling.     Tonsils: No tonsillar exudate or tonsillar abscesses. 3+ on the right. 3+ on the left.  Eyes:     Extraocular Movements: Extraocular movements intact.     Pupils: Pupils are equal, round, and reactive to light.  Cardiovascular:     Rate and  Rhythm: Normal rate and regular rhythm.     Heart sounds: No murmur heard. Pulmonary:     Effort: Pulmonary effort is normal.     Breath sounds: Normal breath sounds.  Abdominal:     Palpations: Abdomen is soft.     Tenderness: There is no right CVA tenderness or left CVA tenderness.  Musculoskeletal:     Right lower leg: No edema.     Left lower leg: No edema.  Skin:    Findings: No lesion or rash.  Neurological:     Mental Status: She is alert and oriented to person, place, and time.     GCS: GCS eye subscore is 4. GCS verbal subscore is 5. GCS motor subscore is 6.     Cranial Nerves: No facial asymmetry.     Sensory: No sensory deficit.     Motor: No weakness.     Coordination: Coordination normal. Finger-Nose-Finger Test normal.     Gait: Gait normal.  Psychiatric:        Judgment: Judgment normal.     Results for orders placed or performed in visit on 04/14/22  POCT urine pregnancy  Result Value Ref Range   Preg Test, Ur Negative Negative   Last CBC Lab Results  Component Value Date   WBC 5.2 05/08/2021   HGB 11.6 (L) 05/08/2021   HCT 34.5 (L) 05/08/2021   MCV 82.7 05/08/2021   MCH 27.8 05/08/2021   RDW 16.6 (H) 05/08/2021   PLT 214 [id number]   Last metabolic panel Lab Results  Component Value Date   GLUCOSE  102 (H) 05/07/2021   NA 135 05/07/2021   K 3.7 05/07/2021   CL 107 05/07/2021   CO2 18 (L) 05/07/2021   BUN 5 (L) 05/07/2021   CREATININE 0.56 05/07/2021   GFRNONAA >60 05/07/2021   CALCIUM 8.9 05/07/2021   PROT 6.4 (L) 05/07/2021   ALBUMIN 2.9 (L) 05/07/2021   LABGLOB 2.8 12/18/2020   AGRATIO 1.4 12/18/2020   BILITOT 0.6 05/07/2021   ALKPHOS 100 05/07/2021   AST 28 05/07/2021   ALT 17 05/07/2021   ANIONGAP 10 05/07/2021   Last lipids Lab Results  Component Value Date   CHOL 203 (H) 09/12/2020   HDL 41 09/12/2020   LDLCALC 148 (H) 09/12/2020   TRIG 75 09/12/2020   CHOLHDL 5.0 (H) 09/12/2020   Last hemoglobin A1c No results found for:  "HGBA1C" Last thyroid functions Lab Results  Component Value Date   TSH 1.190 02/07/2020   Last vitamin D Lab Results  Component Value Date   VD25OH 9.6 (L) 02/07/2020   Last vitamin B12 and Folate No results found for: "VITAMINB12", "FOLATE"      Assessment & Plan:    Routine Health Maintenance and Physical Exam  Immunization History  Administered Date(s) Administered   HPV 9-valent 04/14/2022   Influenza,inj,Quad PF,6+ Mos 02/17/2021, 04/14/2022   PFIZER Comirnaty(Gray Top)Covid-19 Tri-Sucrose Vaccine 05/30/2020, 05/30/2020    Health Maintenance  Topic Date Due   DTaP/Tdap/Td (1 - Tdap) Never done   CHLAMYDIA SCREENING  11/20/2021   COVID-19 Vaccine (3 - 2023-24 season) 11/28/2021   HPV VACCINES (2 - 3-dose series) 05/12/2022   PAP-Cervical Cytology Screening  11/21/2023   PAP SMEAR-Modifier  11/21/2023   INFLUENZA VACCINE  Completed   Hepatitis C Screening  Completed   HIV Screening  Completed    Discussed health benefits of physical activity, and encouraged her to engage in regular exercise appropriate for her age and condition.  Encounter for annual general medical examination with abnormal findings in adult Assessment & Plan: Physical exam as documented Counseling is done on healthy lifestyle involving commitment to 150 minutes of exercise per week,  Discussed heart-healthy diet  Reviewed importance of engaging in physical activities daily Physical activity helps: Lower your blood glucose, improve your heart health, lower your blood pressure and cholesterol, burn calories to help manage her weight, gave you energy, lower stress, and improve his sleep.  The American diabetes Association (ADA) recommends being active for 2-1/2 hours (150 minutes) or more week.  Exercise for 30 minutes, 5 days a week (150 minutes total)    Oral contraception initiation Assessment & Plan: Negative pregnancy test She complains of dysmenorrhea and heavy menstrual  She would  like to be started on oral contraceptive today Denies history of DVT, stroke, and MI Will start patient on combined oral contraceptive today Education as follow: Warning signs (ACHES) Abdominal pain (severe) Chest pain (sharp, severe, shortness of breath) Headache (severe, dizziness, unilateral) Eye problems (scotoma, blurred vision, blind spots) Severe leg pain (calf or thigh)  Instructions for use Quick start method-reasonably certain not pregnant, take first pill on day of office visit; backup method for 7 days if more than 5 days since last menstrual period First day start-take first pill on first day of menses; no backup method needed Take pill at approximately same time each day If nausea occurs, take pill with meals or at bedtime Use backup method (condoms) if efficacy/absorbency compromised by severe vomiting and or diarrhea Use condoms for prevention of STIs and HIV  Common side effects/adverse effects Nausea, increased breast size/breast tenderness, increased blood pressure, venous thromboembolism(VTE), fatigue, depressive symptoms, cyclic weight gain     Orders: -     Levonorgestrel-Ethinyl Estrad; Take 1 tablet by mouth daily.  Dispense: 84 tablet; Refill: 0 -     POCT urine pregnancy  Enlarged tonsils Assessment & Plan: +3 tonsils Complains of having sore throat in the morning and frequent tonsil stones, none noted on examination No fever or chills reported She would like a tonsillectomy Encouraged to follow-up with PCP in the week ortwo      Immunization due -     HPV 9-valent vaccine,Recombinat  Flu vaccine need -     Flu Vaccine QUAD 83mo+IM (Fluarix, Fluzone & Alfiuria Quad PF)  Screening for chlamydial disease -     Chlamydia/GC NAA, Confirmation    Return in about 1 year (around 04/15/2023) for CPE.     Gilmore Laroche, FNP

## 2022-04-14 NOTE — Assessment & Plan Note (Addendum)
+  3 tonsils Complains of having sore throat in the morning and frequent tonsil stones, none noted on examination No fever or chills reported She would like a tonsillectomy Encouraged to follow-up with PCP in the week ortwo

## 2022-04-14 NOTE — Patient Instructions (Addendum)
I appreciate the opportunity to provide care to you today!    Follow up:    1 year for CPE and 1 week for thumb pain with PCP  Please pick up your medications at the pharmacy  Oral contraceptives  Warning signs (ACHES) Abdominal pain (severe) Chest pain (sharp, severe, shortness of breath) Headache (severe, dizziness, unilateral) Eye problems (scotoma, blurred vision, blind spots) Severe leg pain (calf or thigh)  Instructions for use Quick start method-reasonably certain not pregnant, take first pill on day of office visit; backup method for 7 days if more than 5 days since last menstrual period First day start-take first pill on first day of menses; no backup method needed Take pill at approximately same time each day If nausea occurs, take pill with meals or at bedtime Use backup method (condoms) if efficacy/absorbency compromised by severe vomiting and or diarrhea Use condoms for prevention of STIs and HIV   Common side effects/adverse effects Nausea, increased breast size/breast tenderness, increased blood pressure, venous thromboembolism(VTE), fatigue, depressive symptoms, cyclic weight gain    Please continue to a heart-healthy diet and increase your physical activities. Try to exercise for 9mins at least five times a week.      It was a pleasure to see you and I look forward to continuing to work together on your health and well-being. Please do not hesitate to call the office if you need care or have questions about your care.   Have a wonderful day and week. With Gratitude, Alvira Monday MSN, FNP-BC

## 2022-04-18 LAB — CHLAMYDIA/GC NAA, CONFIRMATION
Chlamydia trachomatis, NAA: NEGATIVE
Neisseria gonorrhoeae, NAA: NEGATIVE

## 2022-04-28 ENCOUNTER — Ambulatory Visit: Payer: Managed Care, Other (non HMO) | Admitting: Internal Medicine

## 2022-04-28 ENCOUNTER — Encounter: Payer: Self-pay | Admitting: Internal Medicine

## 2022-04-28 VITALS — BP 152/84 | HR 112 | Ht 64.0 in | Wt 189.0 lb

## 2022-04-28 DIAGNOSIS — I10 Essential (primary) hypertension: Secondary | ICD-10-CM

## 2022-04-28 DIAGNOSIS — G8929 Other chronic pain: Secondary | ICD-10-CM | POA: Insufficient documentation

## 2022-04-28 DIAGNOSIS — J4530 Mild persistent asthma, uncomplicated: Secondary | ICD-10-CM

## 2022-04-28 DIAGNOSIS — E559 Vitamin D deficiency, unspecified: Secondary | ICD-10-CM

## 2022-04-28 DIAGNOSIS — M79645 Pain in left finger(s): Secondary | ICD-10-CM

## 2022-04-28 DIAGNOSIS — R739 Hyperglycemia, unspecified: Secondary | ICD-10-CM

## 2022-04-28 DIAGNOSIS — J0391 Acute recurrent tonsillitis, unspecified: Secondary | ICD-10-CM | POA: Diagnosis not present

## 2022-04-28 DIAGNOSIS — E782 Mixed hyperlipidemia: Secondary | ICD-10-CM | POA: Diagnosis not present

## 2022-04-28 MED ORDER — MUPIROCIN 2 % EX OINT: 1.0000 | TOPICAL_OINTMENT | Freq: Two times a day (BID) | CUTANEOUS | 0 refills | Status: DC

## 2022-04-28 MED ORDER — AMLODIPINE BESYLATE 5 MG PO TABS: 5.0000 mg | ORAL_TABLET | Freq: Every day | ORAL | 1 refills | Status: DC

## 2022-04-28 NOTE — Patient Instructions (Signed)
Please start taking Amlodipine as prescribed.  Please follow DASH diet and perform moderate exercise/walking at least 150 mins/week. 

## 2022-04-30 NOTE — Assessment & Plan Note (Signed)
Well controlled Albuterol PRN Benzonatate PRN for cough

## 2022-04-30 NOTE — Progress Notes (Signed)
Established Patient Office Visit  Subjective:  Patient ID: Bianca Mclaughlin, female    DOB: 2000/11/18  Age: 22 y.o. MRN: [id number]  CC:  Chief Complaint  Patient presents with   Hypertension    Patient is following up with hypertension     HPI Bianca Mclaughlin is a 22 y.o. female with past medical history of mild persistent asthma, eczema and environmental allergies who presents for f/u of her chronic medical conditions.  HTN: Her blood pressure has been elevated recently. She denies any headache, dizziness, chest pain, dyspnea or palpitations.  Recurrent tonsillitis: She has an enlarged tonsils.  She has not had recurrent tonsillitis, requiring antibiotic.  She has chronic sore throat from it.  Denies any current fever, chills, hoarse voice or dyspnea.  She also complains of burning pain around left thumb nail, which is chronic.  She denies any swelling in the area.  Denies any bleeding or discharge.   Past Medical History:  Diagnosis Date   Acne vulgaris 02/20/2015   Asthma    Eczema    Family history of breast cancer in mother 04/15/2017   Start mammogram at 28.Marland KitchenShe is taking various herbal and vitamin products. Mom was 40   High cholesterol    Hypertension    Hypertension in pregnancy, antepartum 05/08/2021   Dx @ [facility], d/c'd on procardia    Had stopped at pp visit, bp slightly elevated    06/16/21 norvasc 5mg , f/u w/ PCP   Pregnancy induced hypertension    Seasonal allergies    Urticaria     Past Surgical History:  Procedure Laterality Date   NO PAST SURGERIES      Family History  Problem Relation Age of Onset   Allergic rhinitis Father    Eczema Father    Urticaria Father    Hypertension Father    Hypertension Mother    Breast cancer Mother 25   Eczema Sister    Breast cancer Maternal Aunt 50   Hypertension Maternal Aunt    Breast cancer Paternal Aunt 28   Asthma Neg Hx    Immunodeficiency Neg Hx     Social History   Socioeconomic History    Marital status: Married    Spouse name: Not on file   Number of children: Not on file   Years of education: Not on file   Highest education level: Not on file  Occupational History    Comment: [employer]  Tobacco Use   Smoking status: Never   Smokeless tobacco: Never  Vaping Use   Vaping Use: Former   Substances: Nicotine, Flavoring  Substance and Sexual Activity   Alcohol use: Not Currently    Comment: Occasionally   Drug use: No   Sexual activity: Yes    Birth control/protection: Pill  Other Topics Concern   Not on file  Social History Narrative   Not on file   Social Determinants of Health   Financial Resource Strain: Low Risk  (06/12/2021)   Overall Financial Resource Strain (CARDIA)    Difficulty of Paying Living Expenses: Not hard at all  Food Insecurity: No Food Insecurity (06/12/2021)   Hunger Vital Sign    Worried About Running Out of Food in the Last Year: Never true    Ran Out of Food in the Last Year: Never true  Transportation Needs: No Transportation Needs (06/12/2021)   PRAPARE - 06/14/2021 (Medical): No    Lack of Transportation (Non-Medical): No  Physical Activity:  Inactive (06/12/2021)   Exercise Vital Sign    Days of Exercise per Week: 0 days    Minutes of Exercise per Session: 0 min  Stress: No Stress Concern Present (06/12/2021)   [place]    Feeling of Stress : Only a little  Social Connections: Moderately Integrated (06/12/2021)   Social Connection and Isolation Panel [NHANES]    Frequency of Communication with Friends and Family: More than three times a week    Frequency of Social Gatherings with Friends and Family: Three times a week    Attends Religious Services: 1 to 4 times per year    Active Member of Clubs or Organizations: No    Attends Archivist Meetings: Never    Marital Status: Married  Human resources officer Violence: Not At Risk  (06/12/2021)   Humiliation, Afraid, Rape, and Kick questionnaire    Fear of Current or Ex-Partner: No    Emotionally Abused: No    Physically Abused: No    Sexually Abused: No    Outpatient Medications Prior to Visit  Medication Sig Dispense Refill   levonorgestrel-ethinyl estradiol (AVIANE) 0.1-20 MG-MCG tablet Take 1 tablet by mouth daily. 84 tablet 0   Prenatal Vit-Fe Fumarate-FA (PRENATAL VITAMIN PO) Take by mouth. (Patient not taking: Reported on 04/14/2022)     amLODipine (NORVASC) 5 MG tablet Take 1 tablet (5 mg total) by mouth daily. (Patient not taking: Reported on 04/14/2022) 30 tablet 2   No facility-administered medications prior to visit.    Allergies  Allergen Reactions   Other Hives, Itching, Swelling and Rash    Cat dander   Macadamia Nut Oil Hives   Tree Extract Hives    ROS Review of Systems  Constitutional:  Negative for chills and fever.  HENT:  Positive for sore throat. Negative for congestion, postnasal drip, sinus pressure and sinus pain.   Eyes:  Negative for pain and discharge.  Respiratory:  Negative for cough and shortness of breath.   Cardiovascular:  Negative for chest pain and palpitations.  Gastrointestinal:  Negative for abdominal pain, constipation, diarrhea, nausea and vomiting.  Endocrine: Negative for polydipsia and polyuria.  Genitourinary:  Negative for dysuria and hematuria.  Musculoskeletal:  Negative for neck pain and neck stiffness.  Skin:  Negative for rash.  Neurological:  Negative for dizziness and weakness.  Psychiatric/Behavioral:  Negative for agitation and behavioral problems.       Objective:    Physical Exam Vitals reviewed.  Constitutional:      General: She is not in acute distress.    Appearance: She is not diaphoretic.  HENT:     Head: Normocephalic and atraumatic.     Nose: Nose normal.     Mouth/Throat:     Mouth: Mucous membranes are moist.     Comments: Enlarged tonsils b/l Eyes:     General: No scleral  icterus.    Extraocular Movements: Extraocular movements intact.  Cardiovascular:     Rate and Rhythm: Normal rate and regular rhythm.     Pulses: Normal pulses.     Heart sounds: Normal heart sounds. No murmur heard. Pulmonary:     Breath sounds: Normal breath sounds. No wheezing or rales.  Musculoskeletal:     Cervical back: Neck supple. No tenderness.     Right lower leg: No edema.     Left lower leg: No edema.  Skin:    General: Skin is warm.     Findings: No  rash.     Comments: No swelling near left thumb  Neurological:     General: No focal deficit present.     Mental Status: She is alert and oriented to person, place, and time.     Sensory: No sensory deficit.     Motor: No weakness.  Psychiatric:        Mood and Affect: Mood normal.        Behavior: Behavior normal.     BP (!) 152/84 (BP Location: Left Arm, Patient Position: Sitting, Cuff Size: Normal)   Pulse (!) 112   Ht 5\' 4"  (1.626 m)   Wt 189 lb (85.7 kg)   SpO2 95%   BMI 32.44 kg/m  Wt Readings from Last 3 Encounters:  04/28/22 189 lb (85.7 kg)  04/14/22 184 lb 0.6 oz (83.5 kg)  06/16/21 169 lb 8 oz (76.9 kg)    Lab Results  Component Value Date   TSH 1.190 02/07/2020   Lab Results  Component Value Date   WBC 5.2 05/08/2021   HGB 11.6 (L) 05/08/2021   HCT 34.5 (L) 05/08/2021   MCV 82.7 05/08/2021   PLT 214 05/08/2021   Lab Results  Component Value Date   NA 135 05/07/2021   K 3.7 05/07/2021   CO2 18 (L) 05/07/2021   GLUCOSE 102 (H) 05/07/2021   BUN 5 (L) 05/07/2021   CREATININE 0.56 05/07/2021   BILITOT 0.6 05/07/2021   ALKPHOS 100 05/07/2021   AST 28 05/07/2021   ALT 17 05/07/2021   PROT 6.4 (L) 05/07/2021   ALBUMIN 2.9 (L) 05/07/2021   CALCIUM 8.9 05/07/2021   ANIONGAP 10 05/07/2021   EGFR 137 12/18/2020   Lab Results  Component Value Date   CHOL 203 (H) 09/12/2020   Lab Results  Component Value Date   HDL 41 09/12/2020   Lab Results  Component Value Date   LDLCALC 148  (H) 09/12/2020   Lab Results  Component Value Date   TRIG 75 09/12/2020   Lab Results  Component Value Date   CHOLHDL 5.0 (H) 09/12/2020   No results found for: "HGBA1C"    Assessment & Plan:   Problem List Items Addressed This Visit       Cardiovascular and Mediastinum   Essential hypertension - Primary    BP Readings from Last 1 Encounters:  04/28/22 (!) 152/84  Uncontrolled with diet alone Started Amlodipine 5 mg QD Counseled for compliance with the medications Advised DASH diet and moderate exercise/walking, at least 150 mins/week       Relevant Medications   amLODipine (NORVASC) 5 MG tablet   Other Relevant Orders   TSH   CMP14+EGFR   CBC with Differential/Platelet     Respiratory   Mild persistent asthma    Well controlled Albuterol PRN Benzonatate PRN for cough      Recurrent tonsillitis    Has an enlarged tonsils, has recurrent infections Referred to ENT for possible tonsillectomy      Relevant Orders   Ambulatory referral to ENT     Other   Mixed hyperlipidemia    Follow low cholesterol diet Repeat lipid profile      Relevant Medications   amLODipine (NORVASC) 5 MG tablet   Other Relevant Orders   Lipid panel   Vitamin D deficiency   Relevant Orders   VITAMIN D 25 Hydroxy (Vit-D Deficiency, Fractures)   Chronic pain of left thumb    Unclear etiology Pain is around the nail, could be paronychia -  mupirocin ointment for now      Relevant Medications   mupirocin ointment (BACTROBAN) 2 %   Other Visit Diagnoses     Hyperglycemia       Relevant Orders   Hemoglobin A1c       Meds ordered this encounter  Medications   amLODipine (NORVASC) 5 MG tablet    Sig: Take 1 tablet (5 mg total) by mouth daily.    Dispense:  90 tablet    Refill:  1   mupirocin ointment (BACTROBAN) 2 %    Sig: Apply 1 Application topically 2 (two) times daily.    Dispense:  22 g    Refill:  0    Follow-up: Return in about 3 months (around 07/28/2022)  for HTN.    Lindell Spar, MD

## 2022-05-01 NOTE — Assessment & Plan Note (Signed)
Follow low cholesterol diet Repeat lipid profile

## 2022-05-01 NOTE — Assessment & Plan Note (Signed)
Has an enlarged tonsils, has recurrent infections Referred to ENT for possible tonsillectomy

## 2022-05-01 NOTE — Assessment & Plan Note (Signed)
BP Readings from Last 1 Encounters:  04/28/22 (!) 152/84   Uncontrolled with diet alone Started Amlodipine 5 mg QD Counseled for compliance with the medications Advised DASH diet and moderate exercise/walking, at least 150 mins/week

## 2022-05-01 NOTE — Assessment & Plan Note (Signed)
Unclear etiology Pain is around the nail, could be paronychia - mupirocin ointment for now

## 2022-06-12 ENCOUNTER — Encounter: Payer: Self-pay | Admitting: Emergency Medicine

## 2022-06-12 ENCOUNTER — Other Ambulatory Visit: Payer: Self-pay

## 2022-06-12 ENCOUNTER — Ambulatory Visit
Admission: EM | Admit: 2022-06-12 | Discharge: 2022-06-12 | Disposition: A | Discharge status: Home / Self Care | Payer: Managed Care, Other (non HMO) | Attending: Family Medicine | Admitting: Family Medicine

## 2022-06-12 DIAGNOSIS — R109 Unspecified abdominal pain: Secondary | ICD-10-CM | POA: Diagnosis not present

## 2022-06-12 DIAGNOSIS — R110 Nausea: Secondary | ICD-10-CM | POA: Diagnosis not present

## 2022-06-12 DIAGNOSIS — R197 Diarrhea, unspecified: Secondary | ICD-10-CM

## 2022-06-12 MED ORDER — LOPERAMIDE HCL 2 MG PO CAPS: 2.0000 mg | ORAL_CAPSULE | Freq: Four times a day (QID) | ORAL | 0 refills | Status: DC | PRN

## 2022-06-12 MED ORDER — ONDANSETRON 4 MG PO TBDP
4.0000 mg | ORAL_TABLET | Freq: Once | ORAL | Status: AC
  Administered 2022-06-12: 4 mg via ORAL

## 2022-06-12 MED ORDER — ONDANSETRON 4 MG PO TBDP: 4.0000 mg | ORAL_TABLET | Freq: Three times a day (TID) | ORAL | 0 refills | Status: DC | PRN

## 2022-06-12 NOTE — ED Provider Notes (Signed)
[facility]    CSN: [id number] Arrival date & time: 06/12/22  1011      History   Chief Complaint Chief Complaint  Patient presents with   Diarrhea    HPI Bianca Mclaughlin is a 22 y.o. female.   Presenting today with 1 day history of abdominal cramping, nausea, diarrhea.  Denies vomiting, fever, body aches, upper respiratory symptoms, new foods, new medications, melena.  She is so far not tried anything over-the-counter for symptoms.  States several coworkers have recently had the same symptoms.    Past Medical History:  Diagnosis Date   Acne vulgaris 02/20/2015   Asthma    Eczema    Family history of breast cancer in mother 04/15/2017   Start mammogram at 67.Marland KitchenShe is taking various herbal and vitamin products. 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 Authorizing Provider  loperamide (IMODIUM) 2 MG capsule Take 1 capsule (2 mg total) by mouth 4 (four) times daily as needed for diarrhea or loose stools. 06/12/22  Yes Volney American, PA-C  ondansetron (ZOFRAN-ODT) 4 MG disintegrating tablet Take 1 tablet (4 mg total) by mouth every 8 (eight) hours as needed for nausea or vomiting. 06/12/22  Yes Volney American, PA-C  amLODipine (NORVASC) 5 MG tablet Take 1 tablet (5 mg total) by mouth daily. 04/28/22   Lindell Spar, MD  levonorgestrel-ethinyl estradiol (AVIANE) 0.1-20 MG-MCG tablet Take 1 tablet by mouth daily. 04/14/22   Alvira Monday, FNP  mupirocin ointment (BACTROBAN) 2 % Apply 1 Application topically 2 (two) times daily. 04/28/22   Lindell Spar, MD  Prenatal Vit-Fe Fumarate-FA (PRENATAL VITAMIN PO) Take by mouth. Patient not taking: Reported on 04/14/2022    [provider]    Family History Family History  Problem Relation Age of Onset   Allergic rhinitis Father    Eczema Father    Urticaria Father    Hypertension Father  Hypertension Mother    Breast cancer Mother 39   Eczema Sister    Breast cancer Maternal Aunt 57   Hypertension Maternal Aunt    Breast cancer Paternal Aunt 45   Asthma Neg Hx    Immunodeficiency Neg Hx     Social History Social History   Tobacco Use   Smoking status: Never   Smokeless tobacco: Never  Vaping Use   Vaping Use: Former   Substances: Nicotine, Flavoring  Substance Use Topics   Alcohol use: Not Currently    Comment: Occasionally   Drug use: No     Allergies   Other, Macadamia nut oil, and Tree extract   Review of Systems Review of Systems Per HPI  Physical Exam Triage Vital Signs ED Triage Vitals [06/12/22 1108]  Enc Vitals Group     BP (!) 157/95     Pulse Rate 86     Resp 20     Temp 98 F (36.7 C)     Temp Source Oral     SpO2 99 %      Weight      Height      Head Circumference      Peak Flow      Pain Score 6     Pain Loc      Pain Edu?      Excl. in [place]?    No data found.  Updated Vital Signs BP (!) 157/95 (BP Location: Right Arm)   Pulse 86   Temp 98 F (36.7 C) (Oral)   Resp 20   LMP 05/31/2022 (Approximate)   SpO2 99%   Breastfeeding No   Visual Acuity Right Eye Distance:   Left Eye Distance:   Bilateral Distance:    Right Eye Near:   Left Eye Near:    Bilateral Near:     Physical Exam Vitals and nursing note reviewed.  Constitutional:      Appearance: Normal appearance. She is not ill-appearing.  HENT:     Head: Atraumatic.     Mouth/Throat:     Mouth: Mucous membranes are moist.  Eyes:     Extraocular Movements: Extraocular movements intact.     Conjunctiva/sclera: Conjunctivae normal.  Cardiovascular:     Rate and Rhythm: Normal rate and regular rhythm.     Heart sounds: Normal heart sounds.  Pulmonary:     Effort: Pulmonary effort is normal.     Breath sounds: Normal breath sounds.  Abdominal:     General: Bowel sounds are normal. There is no distension.     Palpations: Abdomen is soft.     Tenderness: There is no abdominal tenderness. There is no right CVA tenderness, left CVA tenderness or guarding.  Musculoskeletal:        General: Normal range of motion.     Cervical back: Normal range of motion and neck supple.  Skin:    General: Skin is warm and dry.  Neurological:     Mental Status: She is alert and oriented to person, place, and time.  Psychiatric:        Mood and Affect: Mood normal.        Thought Content: Thought content normal.        Judgment: Judgment normal.      UC Treatments / Results  Labs (all labs ordered are listed, but only abnormal results are displayed) Labs Reviewed - No data to display  EKG   Radiology No results found.  Procedures Procedures (including  critical care time)  Medications Ordered in UC Medications  ondansetron  (ZOFRAN-ODT) disintegrating tablet 4 mg (4 mg Oral Given 06/12/22 1202)    Initial Impression / Assessment and Plan / UC Course  I have reviewed the triage vital signs and the nursing notes.  Pertinent labs & imaging results that were available during my care of the patient were reviewed by me and considered in my medical decision making (see chart for details).     Overall vitals and exam reassuring with no red flag findings.  Suspect viral GI illness.  Treat with Zofran, Imodium, brat diet, fluids.  Work note given.  Return for worsening symptoms.  Final Clinical Impressions(s) / UC Diagnoses   Final diagnoses:  Nausea without vomiting  Abdominal cramping  Diarrhea, unspecified type   Discharge Instructions   None    ED Prescriptions     Medication Sig Dispense Auth. Provider   loperamide (IMODIUM) 2 MG capsule Take 1 capsule (2 mg total) by mouth 4 (four) times daily as needed for diarrhea or loose stools. 12 capsule Volney American, PA-C   ondansetron (ZOFRAN-ODT) 4 MG disintegrating tablet Take 1 tablet (4 mg total) by mouth every 8 (eight) hours as needed for nausea or vomiting. 20 tablet Volney American, [place]      PDMP not reviewed this encounter.   Volney American, [place] 06/12/22 1219

## 2022-06-12 NOTE — ED Triage Notes (Signed)
Pt reports diarrhea x4 episodes since this am. Also reports intermittent abd cramping. Denies any known fevers. Reports coworkers have had similar symptoms.

## 2022-06-28 ENCOUNTER — Other Ambulatory Visit: Payer: Self-pay | Admitting: Family Medicine

## 2022-06-28 DIAGNOSIS — Z30011 Encounter for initial prescription of contraceptive pills: Secondary | ICD-10-CM

## 2022-07-14 ENCOUNTER — Telehealth: Payer: Managed Care, Other (non HMO) | Admitting: Physician Assistant

## 2022-07-14 DIAGNOSIS — J4531 Mild persistent asthma with (acute) exacerbation: Secondary | ICD-10-CM

## 2022-07-14 MED ORDER — PREDNISONE 20 MG PO TABS: 40.0000 mg | ORAL_TABLET | Freq: Every day | ORAL | 0 refills | Status: DC

## 2022-07-14 MED ORDER — ALBUTEROL SULFATE HFA 108 (90 BASE) MCG/ACT IN AERS: 1.0000 | INHALATION_SPRAY | Freq: Four times a day (QID) | RESPIRATORY_TRACT | 0 refills | Status: DC | PRN

## 2022-07-14 NOTE — Progress Notes (Signed)
E-Visit for Asthma  Based on what you have shared with me, it looks like you may have a flare up of your asthma.  Asthma is a chronic (ongoing) lung disease which results in airway obstruction, inflammation and hyper-responsiveness.   Asthma symptoms vary from person to person, with common symptoms including nighttime awakening and decreased ability to participate in normal activities as a result of shortness of breath. It is often triggered by changes in weather, changes in the season, changes in air temperature, or inside (home, school, daycare or work) allergens such as animal dander, mold, mildew, woodstoves or cockroaches.   It can also be triggered by hormonal changes, extreme emotion, physical exertion or an upper respiratory tract illness.     It is important to identify the trigger, and then eliminate or avoid the trigger if possible.   If you have been prescribed medications to be taken on a regular basis, it is important to follow the asthma action plan and to follow guidelines to adjust medication in response to increasing symptoms of decreased peak expiratory flow rate  Treatment: I have prescribed: Albuterol (Proventil HFA; Ventolin HFA) 108 (90 Base) MCG/ACT Inhaler 2 puffs into the lungs every six hours as needed for wheezing or shortness of breath and Prednisone  by mouth per day for 5 - 7 days; Prednisone is being used for allergy symptoms as well as asthma.   HOME CARE Only take medications as instructed by your medical team. Consider wearing a mask or scarf to improve breathing air temperature have been shown to decrease irritation and decrease exacerbations Get rest. Taking a steamy shower or using a humidifier may help nasal congestion sand ease sore throat pain. You can place a towel over your head and breathe in the steam from hot water coming from a  faucet. Using a saline nasal spray works much the same way.  Cough drops, hare candies and sore throat lozenges may ease your cough.  Avoid close contacts especially the very you and the elderly Cover your mouth if you cough or sneeze Always remember to wash your hands.    GET HELP RIGHT AWAY IF: You develop worsening symptoms; breathlessness at rest, drowsy, confused or agitated, unable to speak in full sentences You have coughing fits You develop a severe headache or visual changes You develop shortness of breath, difficulty breathing or start having chest pain Your symptoms persist after you have completed your treatment plan If your symptoms do not improve within 10 days  MAKE SURE YOU Understand these instructions. Will watch your condition. Will get help right away if you are not doing well or get worse.   Your e-visit answers were reviewed by a board certified advanced clinical practitioner to complete your personal care plan, Depending upon the condition, your plan could have included both over the counter or prescription medications.   Please review your pharmacy choice. Your safety is important to [place]. If you have drug allergies check your prescription carefully.  You can use MyChart to ask questions about today's visit, request a non-urgent  call back, or ask for a work or school excuse for 24 hours related to this e-Visit. If it has been greater than 24 hours you will need to follow up with your provider, or enter a new e-Visit to address those concerns.   You will get an e-mail in the next two days asking about your experience. I hope that your e-visit has been valuable and will speed your recovery. Thank you  for using e-visits.  I have spent 5 minutes in review of e-visit questionnaire, review and updating patient chart, medical decision making and response to patient.   Margaretann Loveless, PA-C

## 2022-07-29 ENCOUNTER — Ambulatory Visit: Payer: Managed Care, Other (non HMO) | Admitting: Internal Medicine

## 2022-07-29 VITALS — BP 140/86 | HR 99 | Ht 64.0 in | Wt 177.1 lb

## 2022-07-29 DIAGNOSIS — K219 Gastro-esophageal reflux disease without esophagitis: Secondary | ICD-10-CM | POA: Diagnosis not present

## 2022-07-29 DIAGNOSIS — Z23 Encounter for immunization: Secondary | ICD-10-CM | POA: Diagnosis not present

## 2022-07-29 DIAGNOSIS — I10 Essential (primary) hypertension: Secondary | ICD-10-CM

## 2022-07-29 DIAGNOSIS — J0391 Acute recurrent tonsillitis, unspecified: Secondary | ICD-10-CM

## 2022-07-29 DIAGNOSIS — J4530 Mild persistent asthma, uncomplicated: Secondary | ICD-10-CM

## 2022-07-29 MED ORDER — AMLODIPINE BESYLATE 10 MG PO TABS: 10.0000 mg | ORAL_TABLET | Freq: Every day | ORAL | 1 refills | Status: DC

## 2022-07-29 NOTE — Assessment & Plan Note (Signed)
BP Readings from Last 1 Encounters:  07/29/22 (!) 140/86   Uncontrolled with Amlodipine 5 mg QD Increased dose to 10 mg QD Counseled for compliance with the medications Advised DASH diet and moderate exercise/walking, at least 150 mins/week

## 2022-07-29 NOTE — Assessment & Plan Note (Signed)
Has chronic throat irritation, was told about laryngeal reflux by ENT specialist Trial of PPI, has not started yet

## 2022-07-29 NOTE — Progress Notes (Addendum)
Established Patient Office Visit  Subjective:  Patient ID: Bianca Mclaughlin, female    DOB: 2001/01/04  Age: 22 y.o. MRN: [id number]  CC:  Chief Complaint  Patient presents with   Hypertension    3 month f/u     HPI Bianca Mclaughlin is a 22 y.o. female with past medical history of mild persistent asthma, eczema and environmental allergies who presents for f/u of her chronic medical conditions.  HTN: Her blood pressure has been elevated recently. She is taking amlodipine 5 mg QD. She denies any headache, dizziness, chest pain, dyspnea or palpitations.  Recurrent tonsillitis: She had an enlarged tonsils.  She has had recurrent tonsillitis, requiring antibiotic.  She has chronic sore throat from it.  She had ENT evaluation and was told PPI for laryngeal reflux before considering tonsillectomy.  Denies any current fever, chills, hoarse voice or dyspnea.  Past Medical History:  Diagnosis Date   Acne vulgaris 02/20/2015   Asthma    Eczema    Family history of breast cancer in mother 04/15/2017   Start mammogram at 28.Marland KitchenShe is taking various herbal and vitamin products. Mom was 40   High cholesterol    Hypertension    Hypertension in pregnancy, antepartum 05/08/2021   Dx @ [facility], d/c'd on procardia    Had stopped at pp visit, bp slightly elevated    06/16/21 norvasc 5mg , f/u w/ PCP   Pregnancy induced hypertension    Seasonal allergies    Urticaria     Past Surgical History:  Procedure Laterality Date   NO PAST SURGERIES      Family History  Problem Relation Age of Onset   Allergic rhinitis Father    Eczema Father    Urticaria Father    Hypertension Father    Hypertension Mother    Breast cancer Mother 109   Eczema Sister    Breast cancer Maternal Aunt 50   Hypertension Maternal Aunt    Breast cancer Paternal Aunt 76   Asthma Neg Hx    Immunodeficiency Neg Hx     Social History   Socioeconomic History   Marital status: Married    Spouse name: Not on file   Number of  children: Not on file   Years of education: Not on file   Highest education level: Some college, no degree  Occupational History    Comment: [employer]  Tobacco Use   Smoking status: Never   Smokeless tobacco: Never  Vaping Use   Vaping Use: Former   Substances: Nicotine, Flavoring  Substance and Sexual Activity   Alcohol use: Not Currently    Comment: Occasionally   Drug use: No   Sexual activity: Yes    Birth control/protection: Pill  Other Topics Concern   Not on file  Social History Narrative   Not on file   Social Determinants of Health   Financial Resource Strain: Low Risk  (07/29/2022)   Overall Financial Resource Strain (CARDIA)    Difficulty of Paying Living Expenses: Not hard at all  Food Insecurity: No Food Insecurity (07/29/2022)   Hunger Vital Sign    Worried About Running Out of Food in the Last Year: Never true    Ran Out of Food in the Last Year: Never true  Transportation Needs: No Transportation Needs (07/29/2022)   PRAPARE - Administrator, Civil Service (Medical): No    Lack of Transportation (Non-Medical): No  Physical Activity: Insufficiently Active (07/29/2022)   Exercise Vital Sign  Days of Exercise per Week: 2 days    Minutes of Exercise per Session: 40 min  Stress: No Stress Concern Present (07/29/2022)   [employer] of Occupational Health - Occupational Stress Questionnaire    Feeling of Stress : Not at all  Social Connections: Moderately Integrated (07/29/2022)   Social Connection and Isolation Panel [NHANES]    Frequency of Communication with Friends and Family: More than three times a week    Frequency of Social Gatherings with Friends and Family: Three times a week    Attends Religious Services: 1 to 4 times per year    Active Member of Clubs or Organizations: No    Attends Banker Meetings: Not on file    Marital Status: Married  Catering manager Violence: Not At Risk (06/12/2021)   Humiliation, Afraid, Rape,  and Kick questionnaire    Fear of Current or Ex-Partner: No    Emotionally Abused: No    Physically Abused: No    Sexually Abused: No    Outpatient Medications Prior to Visit  Medication Sig Dispense Refill   albuterol (VENTOLIN HFA) 108 (90 Base) MCG/ACT inhaler Inhale 1-2 puffs into the lungs every 6 (six) hours as needed. 8 g 0   AVIANE 0.1-20 MG-MCG tablet Take 1 tablet by mouth once daily 84 tablet 0   amLODipine (NORVASC) 5 MG tablet Take 1 tablet (5 mg total) by mouth daily. 90 tablet 1   loperamide (IMODIUM) 2 MG capsule Take 1 capsule (2 mg total) by mouth 4 (four) times daily as needed for diarrhea or loose stools. 12 capsule 0   mupirocin ointment (BACTROBAN) 2 % Apply 1 Application topically 2 (two) times daily. 22 g 0   ondansetron (ZOFRAN-ODT) 4 MG disintegrating tablet Take 1 tablet (4 mg total) by mouth every 8 (eight) hours as needed for nausea or vomiting. 20 tablet 0   predniSONE (DELTASONE) 20 MG tablet Take 2 tablets (40 mg total) by mouth daily with breakfast. 10 tablet 0   Prenatal Vit-Fe Fumarate-FA (PRENATAL VITAMIN PO) Take by mouth.     No facility-administered medications prior to visit.    Allergies  Allergen Reactions   Other Hives, Itching, Swelling and Rash    Cat dander   Macadamia Nut Oil Hives   Tree Extract Hives    ROS Review of Systems  Constitutional:  Negative for chills and fever.  HENT:  Positive for sore throat. Negative for congestion, postnasal drip, sinus pressure and sinus pain.   Eyes:  Negative for pain and discharge.  Respiratory:  Negative for cough and shortness of breath.   Cardiovascular:  Negative for chest pain and palpitations.  Gastrointestinal:  Negative for abdominal pain, constipation, diarrhea, nausea and vomiting.  Endocrine: Negative for polydipsia and polyuria.  Genitourinary:  Negative for dysuria and hematuria.  Musculoskeletal:  Negative for neck pain and neck stiffness.  Skin:  Negative for rash.   Neurological:  Negative for dizziness and weakness.  Psychiatric/Behavioral:  Negative for agitation and behavioral problems.       Objective:    Physical Exam Vitals reviewed.  Constitutional:      General: She is not in acute distress.    Appearance: She is not diaphoretic.  HENT:     Head: Normocephalic and atraumatic.     Nose: Nose normal.     Mouth/Throat:     Mouth: Mucous membranes are moist.     Comments: Enlarged tonsils b/l Eyes:     General: No scleral  icterus.    Extraocular Movements: Extraocular movements intact.  Cardiovascular:     Rate and Rhythm: Normal rate and regular rhythm.     Pulses: Normal pulses.     Heart sounds: Normal heart sounds. No murmur heard. Pulmonary:     Breath sounds: Normal breath sounds. No wheezing or rales.  Musculoskeletal:     Cervical back: Neck supple. No tenderness.     Right lower leg: No edema.     Left lower leg: No edema.  Skin:    General: Skin is warm.     Findings: No rash.  Neurological:     General: No focal deficit present.     Mental Status: She is alert and oriented to person, place, and time.     Sensory: No sensory deficit.     Motor: No weakness.  Psychiatric:        Mood and Affect: Mood normal.        Behavior: Behavior normal.     BP (!) 140/86 (BP Location: Left Arm)   Pulse 99   Ht 5\' 4"  (1.626 m)   Wt 177 lb 1.9 oz (80.3 kg)   SpO2 97%   BMI 30.40 kg/m  Wt Readings from Last 3 Encounters:  07/29/22 177 lb 1.9 oz (80.3 kg)  04/28/22 189 lb (85.7 kg)  04/14/22 184 lb 0.6 oz (83.5 kg)    Lab Results  Component Value Date   TSH 1.190 02/07/2020   Lab Results  Component Value Date   WBC 5.2 05/08/2021   HGB 11.6 (L) 05/08/2021   HCT 34.5 (L) 05/08/2021   MCV 82.7 05/08/2021   PLT 214 05/08/2021   Lab Results  Component Value Date   NA 135 05/07/2021   K 3.7 05/07/2021   CO2 18 (L) 05/07/2021   GLUCOSE 102 (H) 05/07/2021   BUN 5 (L) 05/07/2021   CREATININE 0.56 05/07/2021    BILITOT 0.6 05/07/2021   ALKPHOS 100 05/07/2021   AST 28 05/07/2021   ALT 17 05/07/2021   PROT 6.4 (L) 05/07/2021   ALBUMIN 2.9 (L) 05/07/2021   CALCIUM 8.9 05/07/2021   ANIONGAP 10 05/07/2021   EGFR 137 12/18/2020   Lab Results  Component Value Date   CHOL 203 (H) 09/12/2020   Lab Results  Component Value Date   HDL 41 09/12/2020   Lab Results  Component Value Date   LDLCALC 148 (H) 09/12/2020   Lab Results  Component Value Date   TRIG 75 09/12/2020   Lab Results  Component Value Date   CHOLHDL 5.0 (H) 09/12/2020   No results found for: "HGBA1C"    Assessment & Plan:   Problem List Items Addressed This Visit       Cardiovascular and Mediastinum   Essential hypertension - Primary    BP Readings from Last 1 Encounters:  07/29/22 (!) 140/86  Uncontrolled with Amlodipine 5 mg QD Increased dose to 10 mg QD Counseled for compliance with the medications Advised DASH diet and moderate exercise/walking, at least 150 mins/week       Relevant Medications   amLODipine (NORVASC) 10 MG tablet     Respiratory   Mild persistent asthma    Well controlled Albuterol PRN Benzonatate PRN for cough      Recurrent tonsillitis    Has an enlarged tonsils, has recurrent infections Had referred to ENT for possible tonsillectomy - advised for conservative management for now        Digestive   Gastroesophageal reflux  disease    Has chronic throat irritation, was told about laryngeal reflux by ENT specialist Trial of PPI, has not started yet      Other Visit Diagnoses     Need for HPV vaccination           Meds ordered this encounter  Medications   amLODipine (NORVASC) 10 MG tablet    Sig: Take 1 tablet (10 mg total) by mouth daily.    Dispense:  90 tablet    Refill:  1    Follow-up: Return in about 5 months (around 12/29/2022) for HTN and HPV #3.    Anabel Halon, MD

## 2022-07-29 NOTE — Assessment & Plan Note (Signed)
Has an enlarged tonsils, has recurrent infections Had referred to ENT for possible tonsillectomy - advised for conservative management for now

## 2022-07-29 NOTE — Assessment & Plan Note (Signed)
Well controlled Albuterol PRN Benzonatate PRN for cough 

## 2022-07-29 NOTE — Patient Instructions (Signed)
Please start taking Amlodipine 10 mg once daily.  Please continue to follow low salt diet and perform moderate exercise/walking at least 150 mins/week.

## 2022-07-30 ENCOUNTER — Other Ambulatory Visit: Payer: Self-pay | Admitting: Internal Medicine

## 2022-07-30 DIAGNOSIS — E559 Vitamin D deficiency, unspecified: Secondary | ICD-10-CM

## 2022-07-30 LAB — CMP14+EGFR
ALT: 31 IU/L (ref 0–32)
AST: 22 IU/L (ref 0–40)
Albumin/Globulin Ratio: 1.8 (ref 1.2–2.2)
Albumin: 4.7 g/dL (ref 4.0–5.0)
Alkaline Phosphatase: 60 IU/L (ref 44–121)
BUN/Creatinine Ratio: 8 — ABNORMAL LOW (ref 9–23)
BUN: 6 mg/dL (ref 6–20)
Bilirubin Total: 0.4 mg/dL (ref 0.0–1.2)
CO2: 21 mmol/L (ref 20–29)
Calcium: 9.3 mg/dL (ref 8.7–10.2)
Chloride: 103 mmol/L (ref 96–106)
Creatinine, Ser: 0.71 mg/dL (ref 0.57–1.00)
Globulin, Total: 2.6 g/dL (ref 1.5–4.5)
Glucose: 87 mg/dL (ref 70–99)
Potassium: 4.5 mmol/L (ref 3.5–5.2)
Sodium: 138 mmol/L (ref 134–144)
Total Protein: 7.3 g/dL (ref 6.0–8.5)
eGFR: 122 mL/min/{1.73_m2} (ref >59)

## 2022-07-30 LAB — CBC WITH DIFFERENTIAL/PLATELET
Basophils Absolute: 0 10*3/uL (ref 0.0–0.2)
Basos: 1 %
EOS (ABSOLUTE): 0.1 10*3/uL (ref 0.0–0.4)
Eos: 2 %
Hematocrit: 41.5 % (ref 34.0–46.6)
Hemoglobin: 13.5 g/dL (ref 11.1–15.9)
Immature Grans (Abs): 0 10*3/uL (ref 0.0–0.1)
Immature Granulocytes: 0 %
Lymphocytes Absolute: 1.4 10*3/uL (ref 0.7–3.1)
Lymphs: 49 %
MCH: 27 pg (ref 26.6–33.0)
MCHC: 32.5 g/dL (ref 31.5–35.7)
MCV: 83 fL (ref 79–97)
Monocytes Absolute: 0.3 10*3/uL (ref 0.1–0.9)
Monocytes: 11 %
Neutrophils Absolute: 1.1 10*3/uL — ABNORMAL LOW (ref 1.4–7.0)
Neutrophils: 37 %
Platelets: 255 10*3/uL (ref 150–450)
RBC: 5 x10E6/uL (ref 3.77–5.28)
RDW: 12.7 % (ref 11.7–15.4)
WBC: 2.9 10*3/uL — ABNORMAL LOW (ref 3.4–10.8)

## 2022-07-30 LAB — LIPID PANEL
Chol/HDL Ratio: 5.3 ratio — ABNORMAL HIGH (ref 0.0–4.4)
Cholesterol, Total: 231 mg/dL — ABNORMAL HIGH (ref 100–199)
HDL: 44 mg/dL (ref >39)
LDL Chol Calc (NIH): 170 mg/dL — ABNORMAL HIGH (ref 0–99)
Triglycerides: 97 mg/dL (ref 0–149)
VLDL Cholesterol Cal: 17 mg/dL (ref 5–40)

## 2022-07-30 LAB — VITAMIN D 25 HYDROXY (VIT D DEFICIENCY, FRACTURES): Vit D, 25-Hydroxy: 10.1 ng/mL — ABNORMAL LOW (ref 30.0–100.0)

## 2022-07-30 LAB — TSH: TSH: 0.802 u[IU]/mL (ref 0.450–4.500)

## 2022-07-30 LAB — HEMOGLOBIN A1C
Est. average glucose Bld gHb Est-mCnc: 114 mg/dL
Hgb A1c MFr Bld: 5.6 % (ref 4.8–5.6)

## 2022-07-30 MED ORDER — VITAMIN D (ERGOCALCIFEROL) 1.25 MG (50000 UNIT) PO CAPS
50000.0000 [IU] | ORAL_CAPSULE | ORAL | 1 refills | Status: DC
Start: 2022-07-30 — End: 2023-03-26

## 2022-09-21 ENCOUNTER — Other Ambulatory Visit: Payer: Self-pay | Admitting: Internal Medicine

## 2022-09-21 DIAGNOSIS — Z30011 Encounter for initial prescription of contraceptive pills: Secondary | ICD-10-CM

## 2022-12-19 ENCOUNTER — Other Ambulatory Visit: Payer: Self-pay | Admitting: Internal Medicine

## 2022-12-19 DIAGNOSIS — Z30011 Encounter for initial prescription of contraceptive pills: Secondary | ICD-10-CM

## 2022-12-31 ENCOUNTER — Encounter: Payer: Self-pay | Admitting: Internal Medicine

## 2022-12-31 ENCOUNTER — Ambulatory Visit: Payer: Managed Care, Other (non HMO) | Admitting: Internal Medicine

## 2022-12-31 VITALS — BP 136/77 | HR 108 | Ht 64.0 in | Wt 168.0 lb

## 2022-12-31 DIAGNOSIS — E782 Mixed hyperlipidemia: Secondary | ICD-10-CM

## 2022-12-31 DIAGNOSIS — E559 Vitamin D deficiency, unspecified: Secondary | ICD-10-CM

## 2022-12-31 DIAGNOSIS — J4530 Mild persistent asthma, uncomplicated: Secondary | ICD-10-CM | POA: Diagnosis not present

## 2022-12-31 DIAGNOSIS — J0391 Acute recurrent tonsillitis, unspecified: Secondary | ICD-10-CM

## 2022-12-31 DIAGNOSIS — J309 Allergic rhinitis, unspecified: Secondary | ICD-10-CM

## 2022-12-31 DIAGNOSIS — Z23 Encounter for immunization: Secondary | ICD-10-CM | POA: Diagnosis not present

## 2022-12-31 DIAGNOSIS — I10 Essential (primary) hypertension: Secondary | ICD-10-CM | POA: Diagnosis not present

## 2022-12-31 MED ORDER — AMLODIPINE BESYLATE 10 MG PO TABS
10.0000 mg | ORAL_TABLET | Freq: Every day | ORAL | 1 refills | Status: AC
Start: 2022-12-31 — End: ?

## 2022-12-31 MED ORDER — AZELASTINE HCL 0.1 % NA SOLN
2.0000 | Freq: Two times a day (BID) | NASAL | 1 refills | Status: AC
Start: 2022-12-31 — End: ?

## 2022-12-31 MED ORDER — MONTELUKAST SODIUM 10 MG PO TABS
10.0000 mg | ORAL_TABLET | Freq: Every day | ORAL | 5 refills | Status: AC
Start: 2022-12-31 — End: ?

## 2022-12-31 MED ORDER — AIRSUPRA 90-80 MCG/ACT IN AERO
2.0000 | INHALATION_SPRAY | Freq: Four times a day (QID) | RESPIRATORY_TRACT | 1 refills | Status: AC | PRN
Start: 2022-12-31 — End: ?

## 2022-12-31 NOTE — Progress Notes (Signed)
Established Patient Office Visit  Subjective:  Patient ID: Bianca Mclaughlin, female    DOB: 31-Mar-2000  Age: 22 y.o. MRN: [id number]  CC:  Chief Complaint  Patient presents with   Hypertension    Five month follow up    Allergies    Patient has allergies, use to see a specialist has not been to that office in years, is wanting allergy medication     HPI Bianca Mclaughlin is a 22 y.o. female with past medical history of mild persistent asthma, eczema and environmental allergies who presents for f/u of her chronic medical conditions.  HTN: Her blood pressure has been wnl recently. She is taking amlodipine 10 mg QD. She denies any headache, dizziness, chest pain, dyspnea or palpitations.  Seasonal allergies: She complains of chronic nasal congestion due to allergies.  She has tried OTC antihistaminics without much relief.  She used to take Singulair with good response.  Asthma: She uses albuterol sporadically, but reports that she has to use it more frequently in winter.  Denies any dyspnea or wheezing currently.  She had asthma exacerbation in 04/24, which required oral steroids.  Recurrent tonsillitis: She had an enlarged tonsils.  She has had recurrent tonsillitis, requiring antibiotic.  She has chronic sore throat from it.  She had ENT evaluation and was told PPI for laryngeal reflux before considering tonsillectomy.  Denies any current fever, chills, hoarse voice or dyspnea.  Past Medical History:  Diagnosis Date   Acne vulgaris 02/20/2015   Asthma    Eczema    Family history of breast cancer in mother 04/15/2017   Start mammogram at 28.Marland KitchenShe is taking various herbal and vitamin products. Mom was 40   High cholesterol    Hypertension    Hypertension in pregnancy, antepartum 05/08/2021   Dx @ [facility], d/c'd on procardia    Had stopped at pp visit, bp slightly elevated    06/16/21 norvasc 5mg , f/u w/ PCP   Pregnancy induced hypertension    Seasonal allergies    Urticaria     Past  Surgical History:  Procedure Laterality Date   NO PAST SURGERIES      Family History  Problem Relation Age of Onset   Allergic rhinitis Father    Eczema Father    Urticaria Father    Hypertension Father    Hypertension Mother    Breast cancer Mother 59   Eczema Sister    Breast cancer Maternal Aunt 50   Hypertension Maternal Aunt    Breast cancer Paternal Aunt 75   Asthma Neg Hx    Immunodeficiency Neg Hx     Social History   Socioeconomic History   Marital status: Married    Spouse name: Not on file   Number of children: Not on file   Years of education: Not on file   Highest education level: Some college, no degree  Occupational History    Comment: [employer]  Tobacco Use   Smoking status: Never   Smokeless tobacco: Never  Vaping Use   Vaping status: Former   Substances: Nicotine, Flavoring  Substance and Sexual Activity   Alcohol use: Not Currently    Comment: Occasionally   Drug use: No   Sexual activity: Yes    Birth control/protection: Pill  Other Topics Concern   Not on file  Social History Narrative   Not on file   Social Determinants of Health   Financial Resource Strain: Low Risk  (07/29/2022)   Overall Physicist, medical Strain (  CARDIA)    Difficulty of Paying Living Expenses: Not hard at all  Food Insecurity: No Food Insecurity (07/29/2022)   Hunger Vital Sign    Worried About Running Out of Food in the Last Year: Never true    Ran Out of Food in the Last Year: Never true  Transportation Needs: No Transportation Needs (07/29/2022)   PRAPARE - Administrator, Civil Service (Medical): No    Lack of Transportation (Non-Medical): No  Physical Activity: Insufficiently Active (07/29/2022)   Exercise Vital Sign    Days of Exercise per Week: 2 days    Minutes of Exercise per Session: 40 min  Stress: No Stress Concern Present (07/29/2022)   [employer] of Occupational Health - Occupational Stress Questionnaire    Feeling of Stress : Not  at all  Social Connections: Moderately Integrated (07/29/2022)   Social Connection and Isolation Panel [NHANES]    Frequency of Communication with Friends and Family: More than three times a week    Frequency of Social Gatherings with Friends and Family: Three times a week    Attends Religious Services: 1 to 4 times per year    Active Member of Clubs or Organizations: No    Attends Banker Meetings: Not on file    Marital Status: Married  Catering manager Violence: Not At Risk (06/12/2021)   Humiliation, Afraid, Rape, and Kick questionnaire    Fear of Current or Ex-Partner: No    Emotionally Abused: No    Physically Abused: No    Sexually Abused: No    Outpatient Medications Prior to Visit  Medication Sig Dispense Refill   omeprazole (PRILOSEC) 40 MG capsule Take by mouth.     AVIANE 0.1-20 MG-MCG tablet Take 1 tablet by mouth once daily 84 tablet 0   Vitamin D, Ergocalciferol, (DRISDOL) 1.25 MG (50000 UNIT) CAPS capsule Take 1 capsule (50,000 Units total) by mouth every 7 (seven) days. 12 capsule 1   albuterol (VENTOLIN HFA) 108 (90 Base) MCG/ACT inhaler Inhale 1-2 puffs into the lungs every 6 (six) hours as needed. 8 g 0   amLODipine (NORVASC) 10 MG tablet Take 1 tablet (10 mg total) by mouth daily. 90 tablet 1   No facility-administered medications prior to visit.    Allergies  Allergen Reactions   Other Hives, Itching, Swelling and Rash    Cat dander   Macadamia Nut Oil Hives   Tree Extract Hives    ROS Review of Systems  Constitutional:  Negative for chills and fever.  HENT:  Positive for congestion. Negative for postnasal drip, sinus pressure and sinus pain.   Eyes:  Negative for pain and discharge.  Respiratory:  Negative for cough and shortness of breath.   Cardiovascular:  Negative for chest pain and palpitations.  Gastrointestinal:  Negative for abdominal pain, diarrhea, nausea and vomiting.  Endocrine: Negative for polydipsia and polyuria.   Genitourinary:  Negative for dysuria and hematuria.  Musculoskeletal:  Negative for neck pain and neck stiffness.  Skin:  Negative for rash.  Neurological:  Negative for dizziness and weakness.  Psychiatric/Behavioral:  Negative for agitation and behavioral problems.       Objective:    Physical Exam Vitals reviewed.  Constitutional:      General: She is not in acute distress.    Appearance: She is not diaphoretic.  HENT:     Head: Normocephalic and atraumatic.     Nose: Congestion present.     Mouth/Throat:  Mouth: Mucous membranes are moist.     Comments: Enlarged tonsils b/l Eyes:     General: No scleral icterus.    Extraocular Movements: Extraocular movements intact.  Cardiovascular:     Rate and Rhythm: Normal rate and regular rhythm.     Heart sounds: Normal heart sounds. No murmur heard. Pulmonary:     Breath sounds: Normal breath sounds. No wheezing or rales.  Musculoskeletal:     Cervical back: Neck supple. No tenderness.     Right lower leg: No edema.     Left lower leg: No edema.  Skin:    General: Skin is warm.     Findings: No rash.  Neurological:     General: No focal deficit present.     Mental Status: She is alert and oriented to person, place, and time.     Sensory: No sensory deficit.     Motor: No weakness.  Psychiatric:        Mood and Affect: Mood normal.        Behavior: Behavior normal.     BP 136/77 (BP Location: Left Arm, Patient Position: Sitting, Cuff Size: Normal)   Pulse (!) 108   Ht 5\' 4"  (1.626 m)   Wt 168 lb (76.2 kg)   SpO2 98%   BMI 28.84 kg/m  Wt Readings from Last 3 Encounters:  12/31/22 168 lb (76.2 kg)  07/29/22 177 lb 1.9 oz (80.3 kg)  04/28/22 189 lb (85.7 kg)    Lab Results  Component Value Date   TSH 0.802 07/29/2022   Lab Results  Component Value Date   WBC 2.9 (L) 07/29/2022   HGB 13.5 07/29/2022   HCT 41.5 07/29/2022   MCV 83 07/29/2022   PLT 255 07/29/2022   Lab Results  Component Value Date    NA 138 07/29/2022   K 4.5 07/29/2022   CO2 21 07/29/2022   GLUCOSE 87 07/29/2022   BUN 6 07/29/2022   CREATININE 0.71 07/29/2022   BILITOT 0.4 07/29/2022   ALKPHOS 60 07/29/2022   AST 22 07/29/2022   ALT 31 07/29/2022   PROT 7.3 07/29/2022   ALBUMIN 4.7 07/29/2022   CALCIUM 9.3 07/29/2022   ANIONGAP 10 05/07/2021   EGFR 122 07/29/2022   Lab Results  Component Value Date   CHOL 231 (H) 07/29/2022   Lab Results  Component Value Date   HDL 44 07/29/2022   Lab Results  Component Value Date   LDLCALC 170 (H) 07/29/2022   Lab Results  Component Value Date   TRIG 97 07/29/2022   Lab Results  Component Value Date   CHOLHDL 5.3 (H) 07/29/2022   Lab Results  Component Value Date   HGBA1C 5.6 07/29/2022      Assessment & Plan:   Problem List Items Addressed This Visit       Cardiovascular and Mediastinum   Essential hypertension - Primary    BP Readings from Last 1 Encounters:  12/31/22 136/77   Well controlled with Amlodipine 10 mg QD Counseled for compliance with the medications Advised DASH diet and moderate exercise/walking, at least 150 mins/week       Relevant Medications   amLODipine (NORVASC) 10 MG tablet   Other Relevant Orders   TSH   CMP14+EGFR   CBC with Differential/Platelet     Respiratory   Mild persistent asthma    Well controlled, but has to use albuterol more frequently in winter Switched to Airsupra Benzonatate PRN for cough  Relevant Medications   montelukast (SINGULAIR) 10 MG tablet   Albuterol-Budesonide (AIRSUPRA) 90-80 MCG/ACT AERO   Other Relevant Orders   CBC with Differential/Platelet   Recurrent tonsillitis    Has an enlarged tonsils, has recurrent infections Had referred to ENT for possible tonsillectomy - advised for conservative management for now      Allergic rhinitis    Has chronic nasal congestion Has tried Singulair in the past Started Singulair 10 mg at bedtime Started Azelastine nasal spray, has tried  Flonase in the past       Relevant Medications   montelukast (SINGULAIR) 10 MG tablet   azelastine (ASTELIN) 0.1 % nasal spray     Other   Mixed hyperlipidemia    Follow low cholesterol diet Repeat lipid profile      Relevant Medications   amLODipine (NORVASC) 10 MG tablet   Other Relevant Orders   Lipid panel   Vitamin D deficiency    Last vitamin D Lab Results  Component Value Date   VD25OH 10.1 (L) 07/29/2022   On Vitamin D 50,000 IU once weekly      Relevant Orders   VITAMIN D 25 Hydroxy (Vit-D Deficiency, Fractures)   Other Visit Diagnoses     Encounter for immunization       Relevant Orders   Flu vaccine trivalent PF, 6mos and older(Flulaval,Afluria,Fluarix,Fluzone) (Completed)        Meds ordered this encounter  Medications   montelukast (SINGULAIR) 10 MG tablet    Sig: Take 1 tablet (10 mg total) by mouth at bedtime.    Dispense:  30 tablet    Refill:  5   amLODipine (NORVASC) 10 MG tablet    Sig: Take 1 tablet (10 mg total) by mouth daily.    Dispense:  90 tablet    Refill:  1   azelastine (ASTELIN) 0.1 % nasal spray    Sig: Place 2 sprays into both nostrils 2 (two) times daily. Use in each nostril as directed    Dispense:  30 mL    Refill:  1   Albuterol-Budesonide (AIRSUPRA) 90-80 MCG/ACT AERO    Sig: Inhale 2 puffs into the lungs every 6 (six) hours as needed.    Dispense:  33 g    Refill:  1    Follow-up: Return in about 4 months (around 05/03/2023) for Annual physical.    Anabel Halon, MD

## 2022-12-31 NOTE — Patient Instructions (Addendum)
Please start taking Singulair as prescribed. Please use Azelastine nasal spray for nasal congestion and allergies.  Please continue to take medications as prescribed.  Please continue to follow low salt diet and perform moderate exercise/walking at least 150 mins/week.  Please get fasting blood tests done before the next visit.

## 2022-12-31 NOTE — Assessment & Plan Note (Signed)
Follow low cholesterol diet Repeat lipid profile

## 2022-12-31 NOTE — Assessment & Plan Note (Signed)
BP Readings from Last 1 Encounters:  12/31/22 136/77   Well controlled with Amlodipine 10 mg QD Counseled for compliance with the medications Advised DASH diet and moderate exercise/walking, at least 150 mins/week

## 2022-12-31 NOTE — Assessment & Plan Note (Signed)
Last vitamin D Lab Results  Component Value Date   VD25OH 10.1 (L) 07/29/2022   On Vitamin D 50,000 IU once weekly

## 2022-12-31 NOTE — Assessment & Plan Note (Signed)
Has an enlarged tonsils, has recurrent infections Had referred to ENT for possible tonsillectomy - advised for conservative management for now

## 2022-12-31 NOTE — Assessment & Plan Note (Addendum)
Well controlled, but has to use albuterol more frequently in winter Switched to Airsupra Benzonatate PRN for cough

## 2022-12-31 NOTE — Assessment & Plan Note (Signed)
Has chronic nasal congestion Has tried Singulair in the past Started Singulair 10 mg at bedtime Started Azelastine nasal spray, has tried Flonase in the past

## 2023-03-21 ENCOUNTER — Telehealth: Payer: Managed Care, Other (non HMO) | Admitting: Family Medicine

## 2023-03-21 DIAGNOSIS — N3000 Acute cystitis without hematuria: Secondary | ICD-10-CM | POA: Diagnosis not present

## 2023-03-21 MED ORDER — CEPHALEXIN 500 MG PO CAPS: 500.0000 mg | ORAL_CAPSULE | Freq: Two times a day (BID) | ORAL | 0 refills | Status: AC

## 2023-03-21 NOTE — Progress Notes (Signed)
E-Visit for Urinary Problems  We are sorry that you are not feeling well.  Here is how we plan to help!  Based on what you shared with me it looks like you most likely have a simple urinary tract infection.  A UTI (Urinary Tract Infection) is a bacterial infection of the bladder.  Most cases of urinary tract infections are simple to treat but a key part of your care is to encourage you to drink plenty of fluids and watch your symptoms carefully.  I have prescribed Keflex 500 mg twice a day for 7 days.  Your symptoms should gradually improve. Call us if the burning in your urine worsens, you develop worsening fever, back pain or pelvic pain or if your symptoms do not resolve after completing the antibiotic.  Urinary tract infections can be prevented by drinking plenty of water to keep your body hydrated.  Also be sure when you wipe, wipe from front to back and don't hold it in!  If possible, empty your bladder every 4 hours.  HOME CARE Drink plenty of fluids Compete the full course of the antibiotics even if the symptoms resolve Remember, when you need to go.go. Holding in your urine can increase the likelihood of getting a UTI! GET HELP RIGHT AWAY IF: You cannot urinate You get a high fever Worsening back pain occurs You see blood in your urine You feel sick to your stomach or throw up You feel like you are going to pass out  MAKE SURE YOU  Understand these instructions. Will watch your condition. Will get help right away if you are not doing well or get worse.   Thank you for choosing an e-visit.  Your e-visit answers were reviewed by a board certified advanced clinical practitioner to complete your personal care plan. Depending upon the condition, your plan could have included both over the counter or prescription medications.  Please review your pharmacy choice. Make sure the pharmacy is open so you can pick up prescription now. If there is a problem, you may contact your  provider through MyChart messaging and have the prescription routed to another pharmacy.  Your safety is important to us. If you have drug allergies check your prescription carefully.   For the next 24 hours you can use MyChart to ask questions about today's visit, request a non-urgent call back, or ask for a work or school excuse. You will get an email in the next two days asking about your experience. I hope that your e-visit has been valuable and will speed your recovery.    have provided 5 minutes of non face to face time during this encounter for chart review and documentation.   

## 2023-03-26 ENCOUNTER — Other Ambulatory Visit: Payer: Self-pay | Admitting: Internal Medicine

## 2023-03-26 DIAGNOSIS — E559 Vitamin D deficiency, unspecified: Secondary | ICD-10-CM

## 2023-04-20 ENCOUNTER — Encounter: Payer: Managed Care, Other (non HMO) | Admitting: Internal Medicine

## 2023-06-24 ENCOUNTER — Encounter: Payer: Managed Care, Other (non HMO) | Admitting: Internal Medicine

## 2023-10-28 ENCOUNTER — Other Ambulatory Visit: Payer: Self-pay | Admitting: Medical Genetics

## 2023-11-10 ENCOUNTER — Other Ambulatory Visit (HOSPITAL_COMMUNITY)

## 2024-01-14 ENCOUNTER — Other Ambulatory Visit: Payer: Self-pay | Admitting: Medical Genetics

## 2024-01-14 DIAGNOSIS — Z006 Encounter for examination for normal comparison and control in clinical research program: Secondary | ICD-10-CM

## 2024-03-29 ENCOUNTER — Telehealth: Admitting: Nurse Practitioner

## 2024-03-29 DIAGNOSIS — R6889 Other general symptoms and signs: Secondary | ICD-10-CM | POA: Diagnosis not present

## 2024-03-29 DIAGNOSIS — J4521 Mild intermittent asthma with (acute) exacerbation: Secondary | ICD-10-CM

## 2024-03-29 MED ORDER — BENZONATATE 100 MG PO CAPS: 100.0000 mg | ORAL_CAPSULE | Freq: Three times a day (TID) | ORAL | 0 refills | Status: AC | PRN

## 2024-03-29 MED ORDER — XOFLUZA (40 MG DOSE) 2 X 20 MG PO TBPK: 40.0000 mg | ORAL_TABLET | Freq: Once | ORAL | 0 refills | Status: AC

## 2024-03-29 MED ORDER — ALBUTEROL SULFATE HFA 108 (90 BASE) MCG/ACT IN AERS: 2.0000 | INHALATION_SPRAY | Freq: Four times a day (QID) | RESPIRATORY_TRACT | 0 refills | Status: AC | PRN

## 2024-03-29 NOTE — Progress Notes (Signed)
 E visit for Flu like symptoms   We are sorry that you are not feeling well.  Here is how we plan to help! Based on what you have shared with me it looks like you may have a respiratory virus that may be influenza.  Influenza or the flu is  an infection caused by a respiratory virus. The flu virus is highly contagious and persons who did not receive their yearly flu vaccination may catch the flu from close contact.  We have anti-viral medications to treat the viruses that cause this infection. They are not a cure and only shorten the course of the infection. These prescriptions are most effective when they are given within the first 2 days of flu symptoms. Antiviral medications are indicated if you have a high risk of complications from the flu. You should  also consider an antiviral medication if you are in close contact with someone who is at risk. These medications can help patients avoid complications from the flu but have side effects that you should know.   Possible side effects from Tamiflu or oseltamivir include nausea, vomiting, diarrhea, dizziness, headaches, eye redness, sleep problems or other respiratory symptoms. You should not take Tamiflu if you have an allergy to oseltamivir or any to the ingredients in Tamiflu.  Based upon your symptoms and potential risk factors I have prescribed Xofluza (Baloxavir) 20mg  tabs (2 tablets) once by mouth (if you a weigh between  88 pounds and 176 pounds- total of 40mg ). We will also send Albuterol  inhaler to the pharmacy as well as medicine for cough support  Meds ordered this encounter  Medications   Baloxavir Marboxil,40 MG Dose, (XOFLUZA, 40 MG DOSE,) 2 x 20 MG TBPK    Sig: Take 40 mg by mouth once for 1 dose.    Dispense:  1 each    Refill:  0   albuterol  (VENTOLIN  HFA) 108 (90 Base) MCG/ACT inhaler    Sig: Inhale 2 puffs into the lungs every 6 (six) hours as needed for wheezing or shortness of breath.    Dispense:  8 g    Refill:  0    benzonatate  (TESSALON ) 100 MG capsule    Sig: Take 1 capsule (100 mg total) by mouth 3 (three) times daily as needed.    Dispense:  30 capsule    Refill:  0       For nasal congestion, you may use an oral decongestant such as Mucinex D or if you have glaucoma or high blood pressure use plain Mucinex.  Saline nasal spray or nasal drops can help and can safely be used as often as needed for congestion.  If you have a sore or scratchy throat, use a saltwater gargle-  to  teaspoon of salt dissolved in a 4-ounce to 8-ounce glass of warm water.  Gargle the solution for approximately 15-30 seconds and then spit.  It is important not to swallow the solution.  You can also use throat lozenges/cough drops and Chloraseptic spray to help with throat pain or discomfort.  Warm or cold liquids can also be helpful in relieving throat pain.  For headache, pain or general discomfort, you can use Ibuprofen  or Tylenol  as directed.   Some authorities believe that zinc sprays or the use of Echinacea may shorten the course of your symptoms.  I have prescribed the following medications to help lessen symptoms: I have prescribed Tessalon  Perles 100 mg. You may take 1-2 capsules every 8 hours as needed for cough  You are to isolate at home until you have been fever-free for at least 24 hours without a fever-reducing medication, and symptoms have been steadily improving for 24 hours.  If you must be around other household members who do not have symptoms, you need to make sure that both you and the family members are masking consistently with a high-quality mask.  If you note any worsening of symptoms despite treatment, please seek an in-person evaluation ASAP. If you note any significant shortness of breath or any chest pain, please seek ED evaluation. Please do not delay care!  ANYONE WHO HAS FLU SYMPTOMS SHOULD: Stay home. The flu is highly contagious and going out or to work exposes others! Be sure to drink  plenty of fluids. Water is fine as well as fruit juices, sodas and electrolyte beverages. You may want to stay away from caffeine or alcohol. If you are nauseated, try taking small sips of liquids. How do you know if you are getting enough fluid? Your urine should be a pale yellow or almost colorless. Get rest. Taking a steamy shower or using a humidifier may help nasal congestion and ease sore throat pain. Using a saline nasal spray works much the same way. Cough drops, hard candies and sore throat lozenges may ease your cough. Line up a caregiver. Have someone check on you regularly.  GET HELP RIGHT AWAY IF: You cannot keep down liquids or your medications. You become short of breath Your fell like you are going to pass out or loose consciousness. Your symptoms persist after you have completed your treatment plan  MAKE SURE YOU  Understand these instructions. Will watch your condition. Will get help right away if you are not doing well or get worse.  Your e-visit answers were reviewed by a board certified advanced clinical practitioner to complete your personal care plan.  Depending on the condition, your plan could have included both over the counter or prescription medications.  If there is a problem please reply  once you have received a response from your provider.  Your safety is important to us .  If you have drug allergies check your prescription carefully.    You can use MyChart to ask questions about todays visit, request a non-urgent call back, or ask for a work or school excuse for 24 hours related to this e-Visit. If it has been greater than 24 hours you will need to follow up with your provider, or enter a new e-Visit to address those concerns.  You will get an e-mail in the next two days asking about your experience.  I hope that your e-visit has been valuable and will speed your recovery. Thank you for using e-visits.   I have spent 5 minutes in review of e-visit  questionnaire, review and updating patient chart, medical decision making and response to patient.   Lauraine Kitty, FNP

## 2024-04-03 ENCOUNTER — Ambulatory Visit: Payer: Self-pay

## 2024-04-03 NOTE — Telephone Encounter (Signed)
 FYI Only or Action Required?: FYI only for provider: appointment scheduled on 01.06.26.  Patient was last seen in primary care on 12/31/2022 by Tobie Suzzane POUR, MD.  Called Nurse Triage reporting Breathing Problem.  Symptoms began several days ago.  Interventions attempted: Prescription medications: Albuterol .  Symptoms are: gradually worsening.  Triage Disposition: See PCP When Office is Open (Within 3 Days)  Patient/caregiver understands and will follow disposition?: Yes   Copied from CRM #[id number]. Topic: Clinical - Red Word Triage >> Apr 03, 2024  2:38 PM Winona R wrote: Pt had an appointment for flu like symptoms 12/31, could not take Tamiflu due to the pharmacy being sold out. Has been using the pump but still experiencing difficulty breathing Reason for Disposition  [1] MODERATE longstanding difficulty breathing (e.g., speaks in phrases, SOB even at rest, pulse 100-120) AND [2] SAME as normal  Answer Assessment - Initial Assessment Questions 1. RESPIRATORY STATUS: Describe your breathing? (e.g., wheezing, shortness of breath, unable to speak, severe coughing)      SOB  2. ONSET: When did this breathing problem begin?      6 days  3. PATTERN Does the difficult breathing come and go, or has it been constant since it started?       Comes and goes  4. SEVERITY: How bad is your breathing? (e.g., mild, moderate, severe)       SOB at times, mainly when talking  5. RECURRENT SYMPTOM: Have you had difficulty breathing before? If Yes, ask: When was the last time? and What happened that time?       recurrent  6. CARDIAC HISTORY: Do you have any history of heart disease? (e.g., heart attack, angina, bypass surgery, angioplasty)       HTN  7. LUNG HISTORY: Do you have any history of lung disease?  (e.g., pulmonary embolus, asthma, emphysema)     Asthma, bronchitis   8. CAUSE: What do you think is causing the breathing problem?      Recently got over the  flu  9. OTHER SYMPTOMS: Pt reports chest pain,  dizziness,  cough,  runny nose        Pt reports SOB and cough Pt is taking OTC Albuterol  5 x per day Pt scheduled for a visit on 01.06.26  for further evaluation and treatment. Pt agrees with plan of care, will call back for any worsening symptoms  Protocols used: Breathing Difficulty-A-AH

## 2024-04-04 ENCOUNTER — Ambulatory Visit (INDEPENDENT_AMBULATORY_CARE_PROVIDER_SITE_OTHER): Admitting: Nurse Practitioner

## 2024-04-04 VITALS — BP 155/79 | HR 100 | Temp 98.4°F | Ht 64.0 in | Wt 161.2 lb

## 2024-04-04 DIAGNOSIS — B9689 Other specified bacterial agents as the cause of diseases classified elsewhere: Secondary | ICD-10-CM

## 2024-04-04 DIAGNOSIS — J069 Acute upper respiratory infection, unspecified: Secondary | ICD-10-CM | POA: Diagnosis not present

## 2024-04-04 DIAGNOSIS — J4531 Mild persistent asthma with (acute) exacerbation: Secondary | ICD-10-CM | POA: Diagnosis not present

## 2024-04-04 MED ORDER — BUDESONIDE-FORMOTEROL FUMARATE 160-4.5 MCG/ACT IN AERO: 2.0000 | INHALATION_SPRAY | Freq: Two times a day (BID) | RESPIRATORY_TRACT | 5 refills | Status: AC

## 2024-04-04 MED ORDER — AMOXICILLIN-POT CLAVULANATE 875-125 MG PO TABS: 1.0000 | ORAL_TABLET | Freq: Two times a day (BID) | ORAL | 0 refills | Status: AC

## 2024-04-04 MED ORDER — PROMETHAZINE-DM 6.25-15 MG/5ML PO SYRP: 5.0000 mL | ORAL_SOLUTION | Freq: Four times a day (QID) | ORAL | 0 refills | Status: AC | PRN

## 2024-04-07 ENCOUNTER — Encounter: Payer: Self-pay | Admitting: Nurse Practitioner

## 2024-04-07 NOTE — Progress Notes (Signed)
 "  Subjective:    Patient ID: Bianca Mclaughlin, female    DOB: 2001/02/19, 24 y.o.   MRN: [id number]  Cough   Discussed the use of AI scribe software for clinical note transcription with the patient, who gave verbal consent to proceed.  History of Present Illness Bianca Mclaughlin is a 24 year old female with asthma who presents with a lingering cough following a flu diagnosis.  She has been experiencing a lingering cough for approximately one week following a flu diagnosis. Initially treated with Xofluza , an albuterol  inhaler, and Tessalon  Perles, the cough persists, especially when lying down or talking, and sometimes occurs spontaneously with a 'tickle' in her throat.  The cough begins as a wet cough and subsequently triggers her asthma, leading to a 'breathy' or 'spasmodic' cough. The albuterol  inhaler provides some relief but does not completely alleviate the symptoms. Tessalon  Perles have not been effective in managing her cough.  No fever for at least one day but continues to experience a sore throat. There is pressure in her ears, which she attributes to congestion. Her asthma symptoms have been exacerbated by the cough, and she has not been using her regular asthma medication due to cost issues, although her Medicaid coverage has recently been reinstated.  Her current medications include Singulair  and albuterol , which she used this morning around 9:30 AM. Her blood pressure is usually stable outside the office, although it was elevated during this visit. No allergies to medications. Taking fluids well. No headache or sinus pressure at this point.   Social History[1]      Objective:   Physical Exam Vitals and nursing note reviewed.  Constitutional:      General: She is not in acute distress. HENT:     Ears:     Comments: TMs significant clear effusion. No erythema.     Nose:     Comments: Nasal mucosa pale and boggy.    Mouth/Throat:     Mouth: Mucous membranes are moist.      Pharynx: No posterior oropharyngeal erythema.     Comments: Greenish PND noted.  Neck:     Comments: Mild anterior cervical adenopathy.  Cardiovascular:     Rate and Rhythm: Normal rate and regular rhythm.  Pulmonary:     Effort: Pulmonary effort is normal.     Breath sounds: Normal breath sounds.     Comments: Occasional non productive cough noted.  Musculoskeletal:     Cervical back: Neck supple.  Lymphadenopathy:     Cervical: Cervical adenopathy present.  Neurological:     Mental Status: She is alert and oriented to person, place, and time.  Psychiatric:        Mood and Affect: Mood normal.        Behavior: Behavior normal.        Thought Content: Thought content normal.    Today's Vitals   04/04/24 1025  BP: (!) 155/79  Pulse: 100  Temp: 98.4 F (36.9 C)  SpO2: 97%  Weight: 161 lb 4 oz (73.1 kg)  Height: 5' 4 (1.626 m)   Body mass index is 27.68 kg/m.         Assessment & Plan:  1. Bacterial URI (Primary) - Prescribed antibiotic for upper respiratory infection. - Prescribed inhaler with long-acting albuterol  and steroid. - Continue albuterol  as rescue inhaler. - Prescribed Phenergan  DM for nighttime cough. - Advised to monitor for fever, chest pain, or increased shortness of breath and seek medical attention if these occur. -  amoxicillin -clavulanate (AUGMENTIN ) 875-125 MG tablet; Take 1 tablet by mouth 2 (two) times daily.  Dispense: 14 tablet; Refill: 0  2. Mild persistent asthma with acute exacerbation - Prescribed antibiotic for upper respiratory infection. - Prescribed inhaler with long-acting albuterol  and steroid. - Continue albuterol  as rescue inhaler. - Prescribed Phenergan  DM for nighttime cough. - Advised to monitor for fever, chest pain, or increased shortness of breath and seek medical attention if these occur. - budesonide -formoterol  (SYMBICORT ) 160-4.5 MCG/ACT inhaler; Inhale 2 puffs into the lungs 2 (two) times daily. For asthma  Dispense: 1  each; Refill: 5 - promethazine -dextromethorphan (PROMETHAZINE -DM) 6.25-15 MG/5ML syrup; Take 5 mLs by mouth 4 (four) times daily as needed. Drowsiness precautions.  Dispense: 118 mL; Refill: 0  Warning signs reviewed. Follow up with PCP if symptoms worsen or persist.        [1]  Social History Tobacco Use   Smoking status: Never   Smokeless tobacco: Never  Vaping Use   Vaping status: Former   Substances: Nicotine, Flavoring  Substance Use Topics   Alcohol use: Not Currently    Comment: Occasionally   Drug use: No   "
